# Patient Record
Sex: Male | Born: 1955 | Race: White | Hispanic: No | Marital: Married | Smoking: Never smoker
Health system: Southern US, Community
[De-identification: ages and names within clinical notes are randomized; demographics above are authoritative.]

## PROBLEM LIST (undated history)

## (undated) DIAGNOSIS — Z8546 Personal history of malignant neoplasm of prostate: Secondary | ICD-10-CM

## (undated) DIAGNOSIS — N390 Urinary tract infection, site not specified: Secondary | ICD-10-CM

## (undated) DIAGNOSIS — N3000 Acute cystitis without hematuria: Secondary | ICD-10-CM

## (undated) DIAGNOSIS — R3129 Other microscopic hematuria: Secondary | ICD-10-CM

## (undated) DIAGNOSIS — R8290 Unspecified abnormal findings in urine: Secondary | ICD-10-CM

## (undated) DIAGNOSIS — E782 Mixed hyperlipidemia: Secondary | ICD-10-CM

## (undated) DIAGNOSIS — M4316 Spondylolisthesis, lumbar region: Principal | ICD-10-CM

## (undated) DIAGNOSIS — M48062 Spinal stenosis, lumbar region with neurogenic claudication: Principal | ICD-10-CM

## (undated) DIAGNOSIS — K56609 Unspecified intestinal obstruction, unspecified as to partial versus complete obstruction: Secondary | ICD-10-CM

## (undated) DIAGNOSIS — K269 Duodenal ulcer, unspecified as acute or chronic, without hemorrhage or perforation: Secondary | ICD-10-CM

## (undated) HISTORY — PX: COLOSTOMY REVERSAL: SHX5782

## (undated) HISTORY — PX: PROSTATECTOMY: SHX69

## (undated) HISTORY — PX: CHOLECYSTECTOMY: SHX55

## (undated) HISTORY — PX: OTHER SURGICAL HISTORY: SHX169

## (undated) HISTORY — PX: COLOSTOMY: SHX63

---

## 2013-08-31 LAB — PSA, DIAGNOSTIC: PSA: 0.01 ng/mL

## 2013-09-01 ENCOUNTER — Encounter

## 2013-09-15 LAB — POCT URINALYSIS DIPSTICK W/O MICROSCOPE (AUTO)
Glucose, UA POC: NEGATIVE
Leukocytes, UA: NEGATIVE
Nitrite, UA: NEGATIVE
Protein, UA POC: NEGATIVE

## 2013-09-15 NOTE — Progress Notes (Signed)
Tama Headings Sande Brothers, MD FACS    Urology Office Progress Note    Patient:  Jake Morales  Date of Birth: March 10, 1956  Date: 09/15/2013    HISTORY OF PRESENT ILLNESS:   The patient is a 57 y.o. male who was last seen on 09-16-2012.    Prostate Cancer  Patient is here today for prostate cancer which was first diagnosed several year(s) ago.  His last several PSA values are as follows:  Lab Results   Component Value Date    PSA 0.01 08/31/2013     His clinical TNM stage was: T1c   His pathological TNM stage was: T2b  Gleason Score: 6  Previous treatment of prostate cancer: RRP 01/13/01   Lower urinary tract symptoms: nocturia, 1-2 times per night  Associated Symptoms: No dysuria, gross hematuria.  Pain Severity: Pain Score:   0 - No pain     Urethral Stricture:  Patient is here today for a urethral stricture; the patient began experiencing difficulty with urination several year(s) ago.    The location of the urethral stricture is: bladder neck  Recently his urinary symptoms: show no change  Current medical Rx for urethral stricture: none  Previous treatments tried for urethral stricture: TUIBNC 06/13/09    Summary of old records:   Prostate Cancer: 01/13/01 RRP T1c T2b G6  Bladder Neck contracture: TUIBNC 06/13/09  Male SUI: mild  Microhematuria    Additional History: Trace blood on UA today; still has occasional stress incontinence-does not require pads    Procedures Today: none    Urinalysis today:  Results for POC orders placed in visit on 09/15/13   POCT URINALYSIS DIPSTICK W/O MICROSCOPE (AUTO)       Result Value Range    Color, UA yellow      Clarity, UA clear      Glucose, UA POC negative      Bilirubin, UA        Ketones, UA        Spec Grav, UA        Blood, UA POC trace-intact      pH, UA        Protein, UA POC negative      Urobilinogen, UA        Leukocytes, UA negative      Nitrite, UA negative       Last several PSA's:  Lab Results   Component Value Date    PSA 0.01 08/31/2013     Last total testosterone:  No  results found for this basename: TESTOSTERONE       AUA Symptom Score (09/15/2013):  Incomplete Emptying: 0  Frequency: 1  Intermittency: 0  Urgency: 2  Weak Stream: 1  Straining: 0  Nocturia: 1  Total AUA Symptom Score (QOL): 5 (3)    Last AUA Symptom Score (QOL): 8 (2)    Last BUN and creatinine:  No results found for this basename: BUN     No results found for this basename: CREATININE       Additional Lab/Culture results: none    Imaging Reviewed during this Office Visit: none  (results were independently reviewed by physician and radiology report verified)    PAST MEDICAL, FAMILY AND SOCIAL HISTORY UPDATE:  Past Medical History   Diagnosis Date   ??? Hypercholesteremia    ??? Diverticulosis    ??? Peptic ulcer    ??? Prostatitis, acute    ??? Prostate cancer, primary, with metastasis from prostate to other  site [facility])    ??? Chronic back pain    ??? Knee pain      Past Surgical History   Procedure Laterality Date   ??? Appendectomy     ??? Cholecystectomy     ??? Colonoscopy     ??? Prostate biopsy       11-28-2000   ??? Prostatectomy       01-13-2001   ??? Bladder surgery       tuibnc 06-13-2009     History reviewed. No pertinent family history.  Outpatient Prescriptions Marked as Taking for the 09/15/13 encounter (Office Visit) with Lane Hacker, MD   Medication Sig Dispense Refill   ??? atorvastatin (LIPITOR) 10 MG tablet Take 10 mg by mouth every other day.       ??? Calcium Polycarbophil (FIBER) 625 MG TABS Take 1 capsule by mouth daily.       ??? Multiple Vitamins-Minerals (THERAPEUTIC MULTIVITAMIN-MINERALS) tablet Take 1 tablet by mouth daily.           Jake Morales flavor; Cheese; Mushroom extract complex; and Pollen extract  History   Smoking status   ??? Never Smoker    Smokeless tobacco   ??? Not on file     (If patient a smoker, smoking cessation counseling offered)    History   Alcohol Use   ??? Yes     Comment: SOCIAL       REVIEW OF SYSTEMS:  Constitutional: negative  Eyes: negative  Neurological: negative  Endocrine:  negative  Gastrointestinal: negative  Cardiovascular: negative  Skin: negative   Musculoskeletal: positive for  Joint pains  Ears/Nose/Throat: negative  Respiratory: negative  Hematological/Lymphatic: negative  Psychological: positive for decreased energy level and depressed mood    Physical Exam:      Filed Vitals:    09/15/13 1644   BP: 134/87   Pulse: 64     Patient is a 57 y.o. male in no acute distress and alert and oriented to person, place and time.      Assessment and Plan      1. Postoperative urethral stricture    2. Microscopic hematuria    3. Stress incontinence, male    4. Personal history of malignant neoplasm of prostate           Plan:      Return in about 1 year (around 09/15/2014) for follow up, PSA.   Kegel exercises for stress urinary incontinence.        Tama Headings Elic Vencill, MD FACS  09/15/2013 4:56 PM

## 2014-01-11 LAB — PSA, DIAGNOSTIC: PSA: 0.01 ng/mL

## 2014-01-12 ENCOUNTER — Encounter

## 2014-09-12 ENCOUNTER — Ambulatory Visit
Admit: 2014-09-12 | Discharge: 2014-09-12 | Payer: BLUE CROSS/BLUE SHIELD | Attending: Specialist | Primary: Family Medicine

## 2014-09-12 DIAGNOSIS — N393 Stress incontinence (female) (male): Secondary | ICD-10-CM

## 2014-09-12 LAB — POCT URINALYSIS DIPSTICK W/O MICROSCOPE (AUTO)
Glucose, UA POC: NEGATIVE
Leukocytes, UA: NEGATIVE
Nitrite, UA: NEGATIVE
Protein, UA POC: NEGATIVE

## 2014-09-12 NOTE — Progress Notes (Signed)
Tama HeadingsGregory D. Sande BrothersHaselhuhn, MD FACS    Urology Office Progress Note    Patient:  Jake GeroldGary J Stecklein  Date of Birth: 02/16/1956  Date: 09/12/2014    HISTORY OF PRESENT ILLNESS:   The patient is a 58 y.o. male who was last seen on 09/15/13.    Prostate Cancer  Patient is here today for prostate cancer which was first diagnosed several years ago.  His last several PSA values are as follows:  Lab Results   Component Value Date    PSA 0.01 01/11/2014    PSA 0.01 08/31/2013     Previous treatment of prostate cancer: RRP 01/13/01  Lower urinary tract symptoms: nocturia, 1 times per night  Associated Symptoms: No dysuria, gross hematuria.  Pain Severity: Pain Score:   0 - No pain/10    Summary of old records:   Prostate Cancer: 01/13/01 RRP T1c T2b G6  Bladder Neck contracture: TUIBNC 06/13/09  Male SUI: mild, 1 minipad per day  Microhematuria    Additional History: Stable microhematuria with negative workup in the past. Told to resume Kegel exercises for mild stress urinary incontinence.    Procedures Today: N/A    Urinalysis today:  Results for POC orders placed in visit on 09/12/14   POCT URINALYSIS DIPSTICK W/O MICROSCOPE (AUTO)       Result Value Ref Range    Color, UA Amber      Clarity, UA Clear      Glucose, UA POC Negative      Bilirubin, UA        Ketones, UA        Spec Grav, UA        Blood, UA POC Moderate      pH, UA        Protein, UA POC Negative      Urobilinogen, UA        Leukocytes, UA Negative      Nitrite, UA Negative       Last several PSA's:  Lab Results   Component Value Date    PSA 0.01 01/11/2014    PSA 0.01 08/31/2013     Last total testosterone:  No results found for: TESTOSTERONE    AUA Symptom Score (09/12/2014):  INCOMPLETE EMPTYING: How often have you had the sensation of not emptying your bladder?: Less than 1 to 5 times  FREQUENCY: How often do you have to urinate less than every two hours?: Less than Half the time  INTERMITTENCY: How often have you found you stopped and started again several times  when you urinated?: Not at all  URGENCY: How often have you found it difficult to postpone urination?: Less than 1 to 5 times  WEAK STREAM: How often have you had a weak urinary stream?: Less than Half the time  STRAINING: How often have you had to strain to start  urination?: Not at all  NOCTURIA: How many times did you typically get up at night to uriniate?: 1 Time  TOTAL I-PSS SCORE:: 7  How would you feel if you were to spend the rest of your life with your urinary condition?: Mostly Satisfied    Last AUA Symptom Score (QOL): 5 (3)    Last BUN and creatinine:  No results found for: BUN  No results found for: CREATININE    Additional Lab/Culture results: none    Imaging Reviewed during this Office Visit: none  (results were independently reviewed by physician and radiology report verified)    PAST MEDICAL,  FAMILY AND SOCIAL HISTORY UPDATE:  Past Medical History   Diagnosis Date   ??? Hypercholesteremia    ??? Diverticulosis    ??? Peptic ulcer    ??? Prostatitis, acute    ??? Personal history of prostate cancer    ??? Chronic back pain    ??? Knee pain      Past Surgical History   Procedure Laterality Date   ??? Appendectomy     ??? Cholecystectomy     ??? Colonoscopy     ??? Prostate biopsy       11-28-2000   ??? Prostatectomy       01-13-2001   ??? Bladder surgery       tuibnc 06-13-2009     Family History   Problem Relation Age of Onset   ??? Family history unknown: Yes     Current Outpatient Prescriptions   Medication Sig Dispense Refill   ??? Calcium Polycarbophil (FIBER) 625 MG TABS Take 1 capsule by mouth daily.     ??? Multiple Vitamins-Minerals (THERAPEUTIC MULTIVITAMIN-MINERALS) tablet Take 1 tablet by mouth daily.       No current facility-administered medications for this visit.       Mushroom extract complex and Pollen extract  History   Smoking status   ??? Never Smoker    Smokeless tobacco   ??? Never Used     (If patient a smoker, smoking cessation counseling offered)    History   Alcohol Use   ??? 0.0 oz/week   ??? 0 Not specified per week      Comment: SOCIAL       REVIEW OF SYSTEMS:  Constitutional: negative  Eyes: negative  Neurological: negative  Endocrine: negative  Gastrointestinal: Positive for diarrhea  Cardiovascular: negative  Skin: negative   Musculoskeletal: positive for  arthralgias  Ears/Nose/Throat: negative  Respiratory: negative  Hematological/Lymphatic: negative  Psychological: negative    Physical Exam:      Filed Vitals:    09/12/14 1414   BP: 122/79   Pulse: 65     Body mass index is 25.48 kg/(m^2).  Patient is a 58 y.o. male in no acute distress and alert and oriented to person, place and time.      Assessment and Plan      1. Stress incontinence, male    2. Microscopic hematuria    3. Impotence of organic origin    4. Personal history of malignant neoplasm of prostate           Plan:      Return in about 1 year (around 09/13/2015) for follow up, PSA.  Did not get PSA prior to today's office visit as requested; will send to lab today to get this drawn.  Stable microhematuria with negative workup in the past.   Told to resume Kegel exercises for mild stress urinary incontinence.       Tama HeadingsGregory D. Imanol Bihl, MD FACS  09/12/2014 2:32 PM    2015 PQRS Documentation: N/A

## 2014-10-03 LAB — PSA, DIAGNOSTIC: PSA: 0.01 ng/mL

## 2014-10-05 ENCOUNTER — Encounter

## 2015-09-18 ENCOUNTER — Ambulatory Visit
Admit: 2015-09-18 | Discharge: 2015-09-18 | Payer: BLUE CROSS/BLUE SHIELD | Attending: Specialist | Primary: Family Medicine

## 2015-09-18 DIAGNOSIS — N393 Stress incontinence (female) (male): Secondary | ICD-10-CM

## 2015-09-18 LAB — POCT URINALYSIS DIPSTICK W/O MICROSCOPE (AUTO)
Glucose, UA POC: NEGATIVE
Leukocytes, UA: NEGATIVE
Nitrite, UA: NEGATIVE

## 2015-09-18 LAB — PSA, DIAGNOSTIC: PSA: 0.01 ng/mL

## 2015-09-18 NOTE — Progress Notes (Signed)
Tama HeadingsGregory D. Sande BrothersHaselhuhn, MD FACS    Urology Office Progress Note    Patient:  Jake GeroldGary J Bloyd  Date of Birth: 16-May-1956  Date: 09/18/2015    HISTORY OF PRESENT ILLNESS:   The patient is a 59 y.o. male who was last seen on 09/12/14.    Prostate Cancer  Patient is here today for prostate cancer which was first diagnosed several years ago.  His last several PSA values are as follows:  Lab Results   Component Value Date    PSA 0.01 10/03/2014    PSA 0.01 01/11/2014    PSA 0.01 08/31/2013     Previous treatment of prostate cancer: RRP 01/13/01  Lower urinary tract symptoms: urgency and nocturia, 1 times per night   Associated Symptoms: No dysuria, gross hematuria.  Pain Severity: Pain Score:   0 - No pain/10    Summary of old records:   Prostate Cancer: 01/13/01 RRP T1c T2b G6  Bladder Neck contracture: TUIBNC 06/13/09  Male SUI: mild, 1 minipad per day  Microhematuria    Additional History: Did not get PSA prior to today's office visit as requested; will send to lab today to get this drawn.  Has mild Stress urinary incontinence; recommended Kegel exercises.  Stable microhematuria with negative workup in the past.      Procedures Today: N/A    Urinalysis today:  Results for POC orders placed in visit on 09/18/15   POCT Urinalysis No Micro (Auto)   Result Value Ref Range    Color, UA yellow     Clarity, UA clear     Glucose, UA POC negative     Bilirubin, UA      Ketones, UA      Spec Grav, UA      Blood, UA POC trace-intact     pH, UA      Protein, UA POC trace     Urobilinogen, UA      Leukocytes, UA negative     Nitrite, UA negative      Last several PSA's:  Lab Results   Component Value Date    PSA 0.01 10/03/2014    PSA 0.01 01/11/2014    PSA 0.01 08/31/2013     Last total testosterone:  No results found for: TESTOSTERONE    AUA Symptom Score (09/18/2015):  INCOMPLETE EMPTYING: How often have you had the sensation of not emptying your bladder?: Less than 1 to 5 times  FREQUENCY: How often do you have to urinate less than  every two hours?: Less than 1 to 5 times  INTERMITTENCY: How often have you found you stopped and started again several times when you urinated?: Not at all  URGENCY: How often have you found it difficult to postpone urination?: Less than Half the time  WEAK STREAM: How often have you had a weak urinary stream?: Less than 1 to 5 times  STRAINING: How often have you had to strain to start  urination?: Not at all  NOCTURIA: How many times did you typically get up at night to uriniate?: 1 Time  TOTAL I-PSS SCORE:: 6  How would you feel if you were to spend the rest of your life with your urinary condition?: Mostly Satisfied    Last AUA Symptom Score (QOL): 7 (2)    Last BUN and creatinine:  No results found for: BUN  No results found for: CREATININE    Additional Lab/Culture results: none    Imaging Reviewed during this Office Visit: none  (  results were independently reviewed by physician and radiology report verified)    PAST MEDICAL, FAMILY AND SOCIAL HISTORY UPDATE:  Past Medical History   Diagnosis Date   ??? Chronic back pain    ??? Diverticulosis    ??? Hypercholesteremia    ??? Knee pain    ??? Peptic ulcer    ??? Personal history of prostate cancer    ??? Prostatitis, acute      Past Surgical History   Procedure Laterality Date   ??? Appendectomy     ??? Cholecystectomy     ??? Colonoscopy     ??? Prostate biopsy       11-28-2000   ??? Prostatectomy       01-13-2001   ??? Bladder surgery       tuibnc 06-13-2009     Family History   Problem Relation Age of Onset   ??? Family history unknown: Yes     Current Outpatient Prescriptions   Medication Sig Dispense Refill   ??? Probiotic Product (PROBIOTIC PO) Take by mouth     ??? ERY 2 % PADS CLEANSE SKIN BID  5   ??? Calcium Polycarbophil (FIBER) 625 MG TABS Take 1 capsule by mouth daily.     ??? Multiple Vitamins-Minerals (THERAPEUTIC MULTIVITAMIN-MINERALS) tablet Take 1 tablet by mouth daily.       No current facility-administered medications for this visit.        Mushroom extract complex and Pollen  extract  History   Smoking Status   ??? Never Smoker   Smokeless Tobacco   ??? Never Used     (If patient a smoker, smoking cessation counseling offered)    History   Alcohol Use   ??? 0.0 oz/week   ??? 0 Standard drinks or equivalent per week     Comment: SOCIAL       REVIEW OF SYSTEMS:  Constitutional: negative  Eyes: negative  Neurological: negative  Endocrine: negative  Gastrointestinal: negative  Cardiovascular: negative  Skin: positive for rash   Musculoskeletal: positive for  Joint pain  Ears/Nose/Throat: positive for sneezing  Respiratory: negative  Hematological/Lymphatic: negative  Psychological: negative    Physical Exam:      Vitals:    09/18/15 1013   BP: 121/78   Pulse: 77     Body mass index is 25.4 kg/(m^2).  Patient is a 59 y.o. male in no acute distress and alert and oriented to person, place and time.      Assessment and Plan      1. Stress incontinence, male    2. Microscopic hematuria    3. Personal history of malignant neoplasm of prostate           Plan:      Return in about 1 year (around 09/17/2016) for PSA.  Did not get PSA prior to today's office visit as requested; will send to lab today to get this drawn.    Has mild Stress urinary incontinence; recommended Kegel exercises.    Stable microhematuria with negative workup in the past.         Tama Headings. Goran Olden, MD FACS  09/18/2015 10:23 AM    2016 PQRS Documentation: N/A

## 2015-09-20 ENCOUNTER — Encounter

## 2016-10-07 ENCOUNTER — Encounter: Payer: BLUE CROSS/BLUE SHIELD | Attending: Specialist | Primary: Family Medicine

## 2016-10-08 LAB — PSA, DIAGNOSTIC: PSA: 0.01 ng/mL

## 2016-12-03 ENCOUNTER — Emergency Department: Admit: 2016-12-03 | Payer: BLUE CROSS/BLUE SHIELD | Primary: Family Medicine

## 2016-12-03 ENCOUNTER — Inpatient Hospital Stay
Admit: 2016-12-03 | Discharge: 2016-12-03 | Disposition: A | Discharge status: Home / Self Care | Payer: BLUE CROSS/BLUE SHIELD | Attending: Emergency Medicine

## 2016-12-03 DIAGNOSIS — E162 Hypoglycemia, unspecified: Secondary | ICD-10-CM

## 2016-12-03 LAB — CBC WITH AUTO DIFFERENTIAL
Absolute Eos #: 0.2 10*3/uL (ref 0.0–0.4)
Absolute Lymph #: 1.3 10*3/uL (ref 1.0–4.8)
Absolute Mono #: 0.8 10*3/uL (ref 0.1–1.2)
Basophils Absolute: 0.1 10*3/uL (ref 0.0–0.2)
Basophils: 1 % (ref 0–2)
Eosinophils %: 5 % — ABNORMAL HIGH (ref 1–4)
Hematocrit: 37.2 % — ABNORMAL LOW (ref 41–53)
Hemoglobin: 12.2 g/dL — ABNORMAL LOW (ref 13.5–17.5)
Lymphocytes: 23 % — ABNORMAL LOW (ref 24–44)
MCH: 28 pg (ref 26–34)
MCHC: 32.9 g/dL (ref 31–37)
MCV: 85.1 fL (ref 80–100)
MPV: 8.6 fL (ref 6.0–12.0)
Monocytes: 15 % — ABNORMAL HIGH (ref 2–11)
Platelets: 223 10*3/uL (ref 140–450)
RBC: 4.38 m/uL — ABNORMAL LOW (ref 4.5–5.9)
RDW: 15.9 % — ABNORMAL HIGH (ref 12.5–15.4)
Seg Neutrophils: 56 % (ref 36–66)
Segs Absolute: 3 10*3/uL (ref 1.8–7.7)
WBC: 5.4 10*3/uL (ref 3.5–11.0)

## 2016-12-03 LAB — BASIC METABOLIC PANEL
Anion Gap: 15 mmol/L (ref 9–17)
BUN: 17 mg/dL (ref 8–23)
CO2: 24 mmol/L (ref 20–31)
Calcium: 8.6 mg/dL (ref 8.6–10.4)
Chloride: 103 mmol/L (ref 98–107)
Creatinine: 0.95 mg/dL (ref 0.70–1.20)
GFR African American: >60 mL/min (ref >60)
GFR Non-African American: >60 mL/min (ref >60)
Glucose: 59 mg/dL — ABNORMAL LOW (ref 70–99)
Potassium: 3.7 mmol/L (ref 3.7–5.3)
Sodium: 142 mmol/L (ref 135–144)

## 2016-12-03 LAB — EKG 12-LEAD
Atrial Rate: 48 {beats}/min
P Axis: 44 degrees
P-R Interval: 156 ms
Q-T Interval: 470 ms
QRS Duration: 88 ms
QTc Calculation (Bazett): 419 ms
R Axis: 22 degrees
T Axis: 29 degrees
Ventricular Rate: 48 {beats}/min

## 2016-12-03 LAB — POC GLUCOSE FINGERSTICK
POC Glucose: 69 mg/dL — ABNORMAL LOW (ref 75–110)
POC Glucose: 146 mg/dL — ABNORMAL HIGH (ref 75–110)
POC Glucose: 75 mg/dL (ref 75–110)

## 2016-12-03 LAB — POCT GLUCOSE: Glucose: 75 mg/dL

## 2016-12-03 MED ORDER — SODIUM CHLORIDE 0.9 % IV BOLUS
0.9 % | Freq: Once | INTRAVENOUS | Status: AC
Start: 2016-12-03 — End: 2016-12-03
  Administered 2016-12-03: 05:00:00 1000 mL via INTRAVENOUS

## 2016-12-03 MED ORDER — DEXTROSE 50 % IV SOLN
50 % | Freq: Once | INTRAVENOUS | Status: AC
Start: 2016-12-03 — End: 2016-12-03

## 2016-12-03 MED ORDER — DEXTROSE 50 % IV SOLN
50 % | Freq: Once | INTRAVENOUS | Status: AC
Start: 2016-12-03 — End: 2016-12-03
  Administered 2016-12-03: 05:00:00 25 g via INTRAVENOUS

## 2016-12-03 MED ORDER — DEXTROSE 50 % IV SOLN
50 % | INTRAVENOUS | Status: AC
Start: 2016-12-03 — End: 2016-12-03
  Administered 2016-12-03: 06:00:00 50 via INTRAVENOUS

## 2016-12-03 MED FILL — DEXTROSE 50 % IV SOLN: 50 % | INTRAVENOUS | Qty: 50

## 2016-12-03 NOTE — Discharge Instructions (Signed)
DEHYDRATION DISCHARGE INFORMATION    WHAT IS DEHYDRATION?    Dehydration happens when you lose more body fluids than you take in. Your body needs water and other fluids to be able to do its normal functions. You may lose both water and minerals (salts), you may lose just salts, or you may lose just water. Your body needs the right balance of minerals to keep your heart, kidneys, and other organs working normally. In severe cases of dehydration, the loss of fluid and electrolyte imbalance can cause you to get very sick and may even be fatal.    Dehydration can be caused by:    Severe diarrhea or vomiting  Too much sweating from fever, strenuous exercise, or being out in very hot weather  Increased urination, caused most often by diabetes or taking medicines that help the body get rid of extra fluid (diuretics)  People who are more at risk for dehydration are:    Babies less than 1 year old  Older adults  People with chronic diseases  People who live in or work or exercise outside in hot humid weather  People who live, work, or exercise at high altitude  Athletes who train for and participate in events that take hours or days to complete  HOW CAN I TAKE CARE OF MYSELF WHEN I GO HOME?    How long it takes to get better depends on the cause of your dehydration, your treatment, how well you recover, your overall health, and any complications you may have.    Management    Your provider will give you a list of your medicines when you leave the hospital.  Know your medicines. Know what they look like, how much you should take each time, how often you should take them, and why you take each one.  Take your medicines exactly as your provider tells you to.  Carry a list of your medicines in your wallet or purse. Include any nonprescription medicines and supplements on the list.  Your provider may prescribe medicine to:  Replace the minerals (salts) you have lost  Treat vomiting or diarrhea  Treat the cause of the  dehydration, such as controlling your blood sugar  Treat or prevent an infection  Appointments    Follow your provider's instructions for follow-up appointments and routine tests.  Talk with your provider about any questions or fears you have.  Diet, Exercise, and Other Lifestyle Changes    Follow the treatment plan your healthcare provider prescribes.  Eat a healthy diet.  Drink enough fluids to keep your urine light yellow in color, unless you are told to limit fluids.  Exercise as your provider recommends.  If you live, work or play in a hot, humid climate:  Drink plenty of fluids and eat foods high in water content, such as vegetables and fruit, in advance of strenuous outdoor activity or work.  Drink more at the first sign you are ill  Don't smoke.  Call emergency medical services or 911 if you have new or worsening:    Confusion  Fainting  Seizure or convulsions  Sunken eyes or not able to make tears  Trouble breathing or not able to catch your breath  Do not drive yourself if you have any of these symptoms.    Call your healthcare provider if you have new or worsening:    Dizziness or lightheadedness, especially when you stand up  Fast, slow, or irregular heartbeat  Headache  Increased thirst and dry   mouth  Decreased urination  Muscle cramps or spasms  Vomiting or diarrhea  Tiredness  Weakness

## 2016-12-03 NOTE — ED Notes (Signed)
Jake BernardJames E Young III, MD at bedside for evaluation.     Debbora PrestoAmber N Levoy Geisen, RN  12/03/16 [phone]

## 2016-12-03 NOTE — ED Notes (Signed)
gramcrackers and milk passed with MD permission      Debbora PrestoAmber N Dawson Hollman, RN  12/03/16 [phone]

## 2016-12-03 NOTE — ED Notes (Signed)
Donna BernardJames E Young III, MD at bedside for evaluation.     Debbora PrestoAmber N Batoul Limes, RN  12/03/16 [phone]

## 2016-12-03 NOTE — ED Provider Notes (Signed)
eMERGENCY dEPARTMENT eNCOUnter      Pt Name: Jake Morales  MRN: [id number]  Birthdate [birth year]  Date of evaluation: 12/03/2016      CHIEF COMPLAINT       Chief Complaint   Patient presents with   ??? Hypoglycemia          HISTORY OF PRESENT ILLNESS    Jake GeroldGary J Fargnoli is a 61 y.o. male who presents To the emergency department by ambulance after having an episode of change in mental status secondary to hypoglycemia at home.  Patient can remember his wife's name didn't exactly know where he was.  Blood sugar was about 60 on scene.  Patient is now alert oriented and doing better after IV fluids were established.  He states that he had been drinking some alcohol and eat a lot of junk food also earlier today.    REVIEW OF SYSTEMS         Review of Systems   Constitutional: Negative.    HENT: Negative.    Eyes: Negative.    Respiratory: Negative.    Cardiovascular: Negative.    Gastrointestinal: Negative.    Genitourinary: Negative.    Musculoskeletal: Negative.    Skin: Negative.    Neurological: Positive for sensory change.   Psychiatric/Behavioral: Negative.        PAST MEDICAL HISTORY    has a past medical history of Chronic back pain; Diverticulosis; Hypercholesteremia; Knee pain; Peptic ulcer; Personal history of prostate cancer; and Prostatitis, acute.    SURGICAL HISTORY      has a past surgical history that includes Appendectomy; Cholecystectomy; Colonoscopy; Prostate Biopsy; Prostatectomy; and Bladder surgery.    CURRENT MEDICATIONS       Previous Medications    ASPIRIN 81 MG CHEWABLE TABLET    Take 81 mg by mouth daily    MULTIPLE VITAMINS-MINERALS (THERAPEUTIC MULTIVITAMIN-MINERALS) TABLET    Take 1 tablet by mouth daily.    POLYETHYLENE GLYCOL 3350 (MIRALAX PO)    Take by mouth    PROBIOTIC PRODUCT (PROBIOTIC PO)    Take by mouth       ALLERGIES     is allergic to mushroom extract complex.    FAMILY HISTORY     has no family status information on file.      Family history is unknown by patient.    SOCIAL HISTORY       reports that he has never smoked. He has never used smokeless tobacco. He reports that he drinks alcohol. He reports that he does not use drugs.    PHYSICAL EXAM     INITIAL VITALS:  height is 5\' 7"  (1.702 m) and weight is 79.8 kg (176 lb). His oral temperature is 98.1 ??F (36.7 ??C). His blood pressure is 119/61 and his pulse is 54. His respiration is 16 and oxygen saturation is 99%.      Physical Exam   Constitutional: He is oriented to person, place, and time. He appears well-nourished.   HENT:   Head: Normocephalic and atraumatic.   Eyes: EOM are normal. Pupils are equal, round, and reactive to light.   Neck: Neck supple.   Cardiovascular: Normal rate and regular rhythm.    Pulmonary/Chest: Effort normal and breath sounds normal.   Abdominal: Soft. Bowel sounds are normal.   Musculoskeletal: Normal range of motion.   Lymphadenopathy:     He has no cervical adenopathy.   Neurological: He is oriented to person, place, and time.   Skin: Skin  is warm and dry. Capillary refill takes 2 to 3 seconds.   Psychiatric: He has a normal mood and affect.   Vitals reviewed.      DIFFERENTIAL DIAGNOSIS/ MDM:     Hypoglycemia uncertain etiology patient will get an amp of D50 a liter of fluid and some labs and be reevaluated.  He'll also get a CT of his head.    DIAGNOSTIC RESULTS     EKG: All EKG's are interpreted by the Emergency Department Physician who either signs or Co-signs this chart in the absence of a cardiologist.    EKG shows a sinus bradycardia at 48 bpm, PR of 0.156, QRS duration 0.088, QTc is 419 ms no ST or T wave changes indicative any ischemia just a slow EKG.    Not indicated unless otherwise documented above    LABS:  Results for orders placed or performed during the hospital encounter of 12/03/16   Basic Metabolic Panel   Result Value Ref Range    Glucose 59 (L) 70 - 99 mg/dL    BUN 17 8 - 23 mg/dL    CREATININE 5.40 9.81 - 1.20 mg/dL    Bun/Cre Ratio NOT REPORTED 9 - 20    Calcium 8.6 8.6 - 10.4 mg/dL     Sodium 191 478 - 295 mmol/L    Potassium 3.7 3.7 - 5.3 mmol/L    Chloride 103 98 - 107 mmol/L    CO2 24 20 - 31 mmol/L    Anion Gap 15 9 - 17 mmol/L    GFR Non-African American >60 >60 mL/min    GFR African American >60 >60 mL/min    GFR Comment          GFR Staging NOT REPORTED    CBC Auto Differential   Result Value Ref Range    WBC 5.4 3.5 - 11.0 k/uL    RBC 4.38 (L) 4.5 - 5.9 m/uL    Hemoglobin 12.2 (L) 13.5 - 17.5 g/dL    Hematocrit 62.1 (L) 41 - 53 %    MCV 85.1 80 - 100 fL    MCH 28.0 26 - 34 pg    MCHC 32.9 31 - 37 g/dL    RDW 30.8 (H) 65.7 - 15.4 %    Platelets 223 140 - 450 k/uL    MPV 8.6 6.0 - 12.0 fL    NRBC Automated NOT REPORTED per 100 WBC    Differential Type NOT REPORTED     Seg Neutrophils 56 36 - 66 %    Lymphocytes 23 (L) 24 - 44 %    Monocytes 15 (H) 2 - 11 %    Eosinophils % 5 (H) 1 - 4 %    Basophils 1 0 - 2 %    Immature Granulocytes NOT REPORTED 0 %    Segs Absolute 3.00 1.8 - 7.7 k/uL    Absolute Lymph # 1.30 1.0 - 4.8 k/uL    Absolute Mono # 0.80 0.1 - 1.2 k/uL    Absolute Eos # 0.20 0.0 - 0.4 k/uL    Basophils # 0.10 0.0 - 0.2 k/uL    Absolute Immature Granulocyte NOT REPORTED 0.00 - 0.30 k/uL    WBC Morphology NOT REPORTED     RBC Morphology NOT REPORTED     Platelet Estimate NOT REPORTED    POC Glucose Fingerstick   Result Value Ref Range    POC Glucose 69 (L) 75 - 110 mg/dL   POCT Glucose   Result  Value Ref Range    Glucose 75 mg/dL    QC OK? yes    POC Glucose Fingerstick   Result Value Ref Range    POC Glucose 75 75 - 110 mg/dL       Not indicated unless otherwise documented above    RADIOLOGY:   I reviewed the radiologist interpretations:  CT Head WO Contrast   Final Result   No acute intracranial abnormality.           Ct Head Wo Contrast    Result Date: 12/03/2016  EXAMINATION: CT OF THE HEAD WITHOUT CONTRAST  12/03/2016 12:46 am TECHNIQUE: CT of the head was performed without the administration of intravenous contrast. Dose modulation, iterative reconstruction, and/or weight based  adjustment of the mA/kV was utilized to reduce the radiation dose to as low as reasonably achievable. COMPARISON: None. HISTORY: ORDERING SYSTEM PROVIDED HISTORY: change mental status TECHNOLOGIST PROVIDED HISTORY: Ordering Physician Provided Reason for Exam: confussion Acuity: Acute Type of Exam: Initial Additional signs and symptoms: low blood sugar FINDINGS: BRAIN/VENTRICLES: There is no acute intracranial hemorrhage, mass effect or midline shift.  No abnormal extra-axial fluid collection.  The gray-white differentiation is maintained without evidence of an acute infarct.  There is no evidence of hydrocephalus. ORBITS: The visualized portion of the orbits demonstrate no acute abnormality. SINUSES: The visualized paranasal sinuses and mastoid air cells demonstrate no acute abnormality. SOFT TISSUES/SKULL:  No acute abnormality of the visualized skull or soft tissues.     No acute intracranial abnormality.     Not indicated unless otherwise documented above    EMERGENCY DEPARTMENT COURSE:     The patient was given the following medications:  Orders Placed This Encounter   Medications   ??? dextrose 50 % solution 25 g   ??? 0.9 % sodium chloride bolus   ??? dextrose 50 % solution     [facility], [facility]: cabinet override   ??? dextrose 50 % solution 25 g        Vitals:    Vitals:    12/03/16 0003   BP: 119/61   Pulse: 54   Resp: 16   Temp: 98.1 ??F (36.7 ??C)   TempSrc: Oral   SpO2: 99%   Weight: 79.8 kg (176 lb)   Height: 5\' 7"  (1.702 m)     -------------------------  BP 119/61    Pulse 54    Temp 98.1 ??F (36.7 ??C) (Oral)    Resp 16    Ht 5\' 7"  (1.702 m)    Wt 79.8 kg (176 lb)    SpO2 99%    BMI 27.57 kg/m??         I have reviewed the disposition diagnosis with the patient and or their family/guardian. I have answered their questions and given discharge instructions. They voiced understanding of these instructions and did not have any further questions or complaints.    CRITICAL  CARE:    None    CONSULTS:    None    PROCEDURES:    None      OARRS Report if indicated             FINAL IMPRESSION      1. Hypoglycemia    2. Dehydration          DISPOSITION/PLAN   DISPOSITION Decision To Discharge  I have reviewed the disposition diagnosis with the patient and or their family/guardian.  I have answered their questions and given discharge instructions.  They voiced understanding of  these instructions and did not have any further questions or complaints.      PATIENT REFERRED TO:  Aundra Dubin, MD  [address]  [place]  [place] [address]  [phone]    In 2 days  referral to an endocrinologist      DISCHARGE MEDICATIONS:  New Prescriptions    No medications on file       (Please note that portions of this note were completed with a voice recognition program.  Efforts were made to edit the dictations but occasionally words are mis-transcribed.)    [name],, MD  Attending Emergency Physician              [name], MD  12/03/16 [phone]

## 2016-12-03 NOTE — ED Notes (Addendum)
Pt to exam room via ems with c/o low blood sugar and mental confusion. POC BS was 69 upon arrival. The pt stated that he has has several episodes of hypoglycemia in the past and usually can manage this on his on but tonight he was attempting to back a truck into the driveway and he knew his sugar was getting low but he continued to try to back the truck up, and he took too long before he attempted to eat. The pt was very confused and had trouble ambulating but this had improved prior to arrival here, the pt ate some candy.      Dorathy KinsmanKevin L Anarely Nicholls, RN  12/03/16 0006       Dorathy KinsmanKevin L Johncarlos Holtsclaw, RN  12/03/16 0010

## 2017-04-13 DIAGNOSIS — S60452A Superficial foreign body of right middle finger, initial encounter: Secondary | ICD-10-CM

## 2017-04-13 NOTE — ED Notes (Signed)
Pt wound cleansed, bacitracin applied to finger and tube gauze dressing applied to rt middle finger per MD order.      Deanne CofferJennifer N Jyron Turman, RN  04/13/17 2300

## 2017-04-13 NOTE — ED Notes (Signed)
Pt denies any s/s reaction to IM injection. Patient cleared for discharge per MD. Patient discharge instructions explained, Rx given and explained to patient. Patient Verbalized understanding of all instructions and all patient questions answered to their satisfaction. Patient departs from ED in stable condition.        Deanne CofferJennifer N Riely Baskett, RN  04/13/17 2300

## 2017-04-13 NOTE — ED Provider Notes (Signed)
[facility]  eMERGENCY dEPARTMENT eNCOUnter      Pt Name: Jake GeroldGary J Chaikin  MRN: [id number]  Birthdate 05-15-1956  Date of evaluation: 04/14/17      CHIEF COMPLAINT       Chief Complaint   Patient presents with   . Hand Injury         HISTORY OF PRESENT ILLNESS    Jake Morales is a 61 y.o. male who presents To the emergency department complaining of fishhook embedded in the right middle finger at about 845 p.m. tonight accidentally while fishing.  Patient complains of sharp pain 5-6 out of 10 in intensity localized to the terminal phalanx of the right middle finger.  He denies any tingling, numbness or weakness distally.  Pain is worse with the movements and better with rest.  Patient has not taken any medications for the pain.  He is right-hand dominant.  Patient does not take any blood thinners.  Last tetanus injection is unknown.  Denies any previous injuries or fractures to the right hand.       REVIEW OF SYSTEMS       Review of Systems   Constitutional: Negative for chills and fever.   Skin: Positive for wound.   Neurological: Negative for weakness and numbness.   All other systems reviewed and are negative.         PAST MEDICAL HISTORY    has a past medical history of Chronic back pain; Diverticulosis; Hypercholesteremia; Knee pain; Peptic ulcer; Personal history of prostate cancer; and Prostatitis, acute.    SURGICAL HISTORY      has a past surgical history that includes Appendectomy; Cholecystectomy; Colonoscopy; Prostate Biopsy; Prostatectomy; Bladder surgery; colostomy; Revision Colostomy; and colectomy.    CURRENT MEDICATIONS       Discharge Medication List as of 04/13/2017 10:30 PM      CONTINUE these medications which have NOT CHANGED    Details   aspirin 81 MG chewable tablet Take 81 mg by mouth dailyHistorical Med      Polyethylene Glycol 3350 (MIRALAX PO) Take by mouthHistorical Med      Probiotic Product (PROBIOTIC PO) Take by mouth      Multiple Vitamins-Minerals (THERAPEUTIC  MULTIVITAMIN-MINERALS) tablet Take 1 tablet by mouth daily.             ALLERGIES     is allergic to mushroom extract complex.    FAMILY HISTORY     has no family status information on file.      Family history is unknown by patient.    SOCIAL HISTORY      reports that he has never smoked. He has never used smokeless tobacco. He reports that he drinks alcohol. He reports that he does not use drugs.    PHYSICAL EXAM     INITIAL VITALS:  height is 5\' 10"  (1.778 m) and weight is 79.4 kg (175 lb). His oral temperature is 98.1 F (36.7 C). His blood pressure is 137/82 and his pulse is 63. His respiration is 16 and oxygen saturation is 99%.      Physical Exam   Constitutional: He is oriented to person, place, and time. He appears well-developed and well-nourished.   HENT:   Head: Normocephalic and atraumatic.   Nose: Nose normal.   Mouth/Throat: Oropharynx is clear and moist.   Eyes: EOM are normal. Pupils are equal, round, and reactive to light.   Neck: Normal range of motion. Neck supple.   Cardiovascular: Normal rate, regular  rhythm, normal heart sounds and intact distal pulses.    No murmur heard.  Pulmonary/Chest: Effort normal and breath sounds normal. No respiratory distress.   Abdominal: Soft. Bowel sounds are normal. He exhibits no distension. There is no tenderness.   Musculoskeletal:   Patient has a fishhook embedded on the radial aspect of the right hand in the terminal phalanx of the middle finger.  The nail plate and nail bed are not involved.  Neurovascular examination is intact distally.   Neurological: He is alert and oriented to person, place, and time.   Skin: Skin is warm and dry.   Nursing note and vitals reviewed.      DIFFERENTIAL DIAGNOSIS/ MDM:     Fishhook right middle finger  Will removal under ring block of the right middle finger    DIAGNOSTIC RESULTS     EKG: All EKG's are interpreted by the Emergency Department Physician who either signs or Co-signs this chart in the absence of a  cardiologist.    None obtained    RADIOLOGY:   Non-plain film images such as CT, Ultrasound and MRI are read by the radiologist. Plain radiographic images are visualized and the radiologist interpretations are reviewed as follows:     None obtained    LABS:  No results found for this visit on 04/13/17.      EMERGENCY DEPARTMENT COURSE:   Vitals:    Vitals:    04/13/17 2134   BP: 137/82   Pulse: 63   Resp: 16   Temp: 98.1 F (36.7 C)   TempSrc: Oral   SpO2: 99%   Weight: 79.4 kg (175 lb)   Height: 5\' 10"  (1.778 m)     -------------------------  BP: 137/82, Temp: 98.1 F (36.7 C), Pulse: 63, Resp: 16    Orders Placed This Encounter   Medications   . lidocaine 1 % injection 5 mL   . Tetanus-Diphth-Acell Pertussis (BOOSTRIX) injection 0.5 mL   . bacitracin ointment   . cephALEXin (KEFLEX) 500 MG capsule     Sig: Take 1 capsule by mouth 4 times daily for 7 days     Dispense:  28 capsule     Refill:  0         During emergency department course, patient was given Boostrix 0.5 mg intramuscularly.  The foreign body was removed by myself.  Please see procedure note.  Patient was given wound care instructions and discharged home on Keflex 500 mg 4 times daily for 7 days.  He is advised to take Tylenol and/or ibuprofen as needed for the pain, apply ice packs locally, elevate the right hand, follow up with PCP for wound recheck in 2 days, return if worse.    CONSULTS:  None    PROCEDURES:  Under ring block with 1% lidocaine without epinephrine of the right middle finger and under strict aseptic precautions the foreign body in the form of fishhook was removed by myself.  The entrance wound was entered with the help of 18-gauge needle, the barb of the fishhook was held along with the bevel side of the needle and the 18-gauge needle and a fishhook were removed successfully.  Patient tolerated the procedure well.  Bacitracin and a dressing was applied.    FINAL IMPRESSION      1. Fish hook injury of finger, right, initial  encounter          DISPOSITION/PLAN       PATIENT REFERRED TO:  Aundra Dubin, MD  [address]  [place]  [place] [address]  [phone]    Call in 2 days  For wound re-check    [facility]  [id number]  [place] [address]  [phone]    If symptoms worsen      DISCHARGE MEDICATIONS:  Discharge Medication List as of 04/13/2017 10:30 PM      START taking these medications    Details   cephALEXin (KEFLEX) 500 MG capsule Take 1 capsule by mouth 4 times daily for 7 days, Disp-28 capsule, R-0Print             (Please note that portions of this note were completed with a voice recognition program.  Efforts were made to edit the dictations but occasionally words are mis-transcribed.)    Lenice Pressman,, MD, F.A.C.E.P.  Attending Emergency Medicine Physician         Lenice Pressman, MD  04/14/17 [phone]

## 2017-04-14 ENCOUNTER — Inpatient Hospital Stay
Admit: 2017-04-14 | Discharge: 2017-04-14 | Disposition: A | Discharge status: Home / Self Care | Payer: BLUE CROSS/BLUE SHIELD | Attending: Specialist

## 2017-04-14 MED ORDER — TETANUS-DIPHTH-ACELL PERTUSSIS 5-2.5-18.5 LF-MCG/0.5 IM SUSP
Freq: Once | INTRAMUSCULAR | Status: AC
Start: 2017-04-14 — End: 2017-04-13
  Administered 2017-04-14: 03:00:00 0.5 mL via INTRAMUSCULAR

## 2017-04-14 MED ORDER — BACITRACIN 500 UNIT/GM EX OINT
500 UNIT/GM | Freq: Once | CUTANEOUS | Status: AC
Start: 2017-04-14 — End: 2017-04-13
  Administered 2017-04-14: 03:00:00 via TOPICAL

## 2017-04-14 MED ORDER — CEPHALEXIN 500 MG PO CAPS
500 MG | ORAL_CAPSULE | Freq: Four times a day (QID) | ORAL | 0 refills | Status: AC
Start: 2017-04-14 — End: 2017-04-20

## 2017-04-14 MED ORDER — LIDOCAINE HCL 1 % IJ SOLN
1 % | Freq: Once | INTRAMUSCULAR | Status: AC
Start: 2017-04-14 — End: 2017-04-13
  Administered 2017-04-14: 02:00:00 5 mL via INTRADERMAL

## 2017-04-14 MED FILL — LIDOCAINE HCL 1 % IJ SOLN: 1 % | INTRAMUSCULAR | Qty: 20

## 2017-04-14 MED FILL — BOOSTRIX 5-2.5-18.5 IM SUSP: INTRAMUSCULAR | Qty: 0.5

## 2017-05-13 LAB — PSA, DIAGNOSTIC: PSA: 0.02 ng/mL

## 2017-07-02 ENCOUNTER — Ambulatory Visit
Admit: 2017-07-02 | Discharge: 2017-07-02 | Payer: BLUE CROSS/BLUE SHIELD | Attending: Specialist | Primary: Family Medicine

## 2017-07-02 DIAGNOSIS — R3129 Other microscopic hematuria: Secondary | ICD-10-CM

## 2017-07-02 LAB — POCT URINALYSIS DIPSTICK W/O MICROSCOPE (AUTO)
Glucose, UA POC: NEGATIVE
Leukocytes, UA: NEGATIVE
Nitrite, UA: NEGATIVE
Protein, UA POC: NEGATIVE

## 2017-07-02 NOTE — Progress Notes (Signed)
Tama Headings Sande Brothers, MD FACS    Urology Office Progress Note    Patient:  Jake Morales  Date of Birth: [birth year]  Date: 07/02/2017    HISTORY OF PRESENT ILLNESS:   The patient is a 61 y.o. male who was last seen on 09/18/15.    MicroHematuria:  Patient is here today with a history microscopic hematuria which was first noted several year(s) ago.    On today's Urinalysis the blood is unchanged.    Lower urinary tract symptoms: nocturia, 1 times per night and urine incontinence:  stress    Associated Symptoms: No dysuria, gross hematuria.  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   Stable microhematuria with negative workup in the past.   Patient has mild and sporadic Stress urinary incontinence; no further Rx at this time.     Procedures Today: N/A    Urinalysis today:  Results for POC orders placed in visit on 07/02/17   POCT Urinalysis No Micro (Auto)   Result Value Ref Range    Color, UA yellow     Clarity, UA clear     Glucose, UA POC negative     Bilirubin, UA      Ketones, UA      Spec Grav, UA      Blood, UA POC trace-lysed     pH, UA      Protein, UA POC negative     Urobilinogen, UA      Leukocytes, UA negative     Nitrite, UA negative      Last several PSA's:  Lab Results   Component Value Date    PSA 0.02 05/13/2017    PSA 0.01 10/08/2016    PSA 0.01 09/18/2015     Last total testosterone:  No results found for: TESTOSTERONE    AUA Symptom Score (07/02/2017):  INCOMPLETE EMPTYING: How often have you had the sensation of not emptying your bladder?: Less than 1 to 5 times  FREQUENCY: How often do you have to urinate less than every two hours?: Less than 1 to 5 times  INTERMITTENCY: How often have you found you stopped and started again several times when you urinated?: Not at  all  URGENCY: How often have you found it difficult to postpone urination?: Less than 1 to 5 times  WEAK STREAM: How often have you had a weak urinary stream?: Less than 1 to 5 times  STRAINING: How often have you had to strain to start  urination?: Not at all  NOCTURIA: How many times did you typically get up at night to uriniate?: 1 Time  TOTAL I-PSS SCORE:: 5  How would you feel if you were to spend the rest of your life with your urinary condition?: Mixe    Last AUA Symptom Score (QOL): 6 (2)    Last BUN and creatinine:  Lab Results   Component Value Date    BUN 17 12/03/2016     Lab Results   Component Value Date    CREATININE 0.95 12/03/2016       Additional Lab/Culture results: none    Imaging Reviewed during this Office Visit: none  (results were independently reviewed by physician and radiology report verified)  PAST MEDICAL, FAMILY AND SOCIAL HISTORY UPDATE:  Past Medical History:   Diagnosis Date   . Chronic back pain    . Diverticulosis    . Hypercholesteremia    . Knee pain    . Peptic ulcer    . Personal history of prostate cancer    . Prostatitis, acute      Past Surgical History:   Procedure Laterality Date   . APPENDECTOMY     . BLADDER SURGERY      tuibnc 06-13-2009   . CHOLECYSTECTOMY     . COLECTOMY     . COLONOSCOPY     . COLOSTOMY     . PROSTATE BIOPSY      11-28-2000   . PROSTATECTOMY      01-13-2001   . REVISION COLOSTOMY       Family History   Problem Relation Age of Onset   . Family history unknown: Yes     Current Outpatient Prescriptions   Medication Sig Dispense Refill   . ferrous sulfate 325 (65 Fe) MG tablet TK 1 T PO BID WITH MEALS QOD  3   . aspirin 81 MG chewable tablet Take 81 mg by mouth daily     . Polyethylene Glycol 3350 (MIRALAX PO) Take by mouth     . Probiotic Product (PROBIOTIC PO) Take by mouth     . Multiple Vitamins-Minerals (THERAPEUTIC MULTIVITAMIN-MINERALS) tablet Take 1 tablet by mouth daily.       No current facility-administered medications for this visit.        Bee  pollen and Mushroom extract complex  History   Smoking Status   . Never Smoker   Smokeless Tobacco   . Never Used     (If patient a smoker, smoking cessation counseling offered)    History   Alcohol Use   . 0.0 oz/week     Comment: SOCIAL       REVIEW OF SYSTEMS (obtained by ancillary medical staff):  Constitutional: negative  Eyes: negative  Neurological: negative  Endocrine: positive for heat intolerance  Gastrointestinal: Positive for diarrhea  Cardiovascular: negative  Skin: negative   Musculoskeletal: positive for  arthralgias  Ears/Nose/Throat: negative  Respiratory: negative  Hematological/Lymphatic: negative  Psychological: negative    Physical Exam:      Vitals:    07/02/17 1637   BP: (!) 146/82   Pulse: 75     Body mass index is 25.18 kg/m.  Patient is a 61 y.o. male in no acute distress and alert and oriented to person, place and time.      Assessment and Plan      1. Microscopic hematuria    2. Stress incontinence, male    3. Personal history of malignant neoplasm of prostate           Plan:      Return in about 1 year (around 07/02/2018) for PSA.  No evidence of prostate cancer recurrence.     Stable microhematuria with negative workup in the past.     Patient has mild and sporadic Stress urinary incontinence; no further Rx at this time.            Tama Headings Fines Kimberlin, MD FACS  07/02/2017 4:46 PM    2018 PQRS Documentation: N/A

## 2017-12-19 MED ORDER — SILDENAFIL CITRATE 20 MG PO TABS
20 | ORAL_TABLET | ORAL | 11 refills | Status: DC | PRN
Start: 2017-12-19 — End: 2020-01-19

## 2017-12-19 NOTE — Telephone Encounter (Signed)
See written Rx for generic sildenafil 20 mg (1-5 tabs) prn ED.

## 2017-12-19 NOTE — Telephone Encounter (Signed)
Patient called to see if he could get another sample of Viagra.  He said you gave it to him quite a while ago.  He was last seen 07/02/17 and is due back 07/02/18.  I told him Viagra is now generic Sildenafil and told him about the Good Rx card and list of places he can get it.  So he would like a printed  Rx for Sildenafil and he will pick it up next week along with the Good Rx card if that is ok.

## 2018-01-18 ENCOUNTER — Inpatient Hospital Stay (HOSPITAL_COMMUNITY): Payer: BLUE CROSS/BLUE SHIELD

## 2018-01-18 ENCOUNTER — Inpatient Hospital Stay (HOSPITAL_COMMUNITY)
Admission: EM | Admit: 2018-01-18 | Discharge: 2018-01-20 | DRG: 389 | Disposition: A | Discharge status: Home / Self Care | Payer: BLUE CROSS/BLUE SHIELD | Attending: Family Medicine | Admitting: Family Medicine

## 2018-01-18 ENCOUNTER — Emergency Department (HOSPITAL_COMMUNITY): Payer: BLUE CROSS/BLUE SHIELD

## 2018-01-18 ENCOUNTER — Encounter (HOSPITAL_COMMUNITY): Payer: Self-pay

## 2018-01-18 DIAGNOSIS — R112 Nausea with vomiting, unspecified: Secondary | ICD-10-CM | POA: Diagnosis not present

## 2018-01-18 DIAGNOSIS — R1084 Generalized abdominal pain: Secondary | ICD-10-CM | POA: Diagnosis not present

## 2018-01-18 DIAGNOSIS — N179 Acute kidney failure, unspecified: Secondary | ICD-10-CM | POA: Diagnosis present

## 2018-01-18 DIAGNOSIS — D649 Anemia, unspecified: Secondary | ICD-10-CM | POA: Diagnosis present

## 2018-01-18 DIAGNOSIS — Z0189 Encounter for other specified special examinations: Secondary | ICD-10-CM

## 2018-01-18 DIAGNOSIS — K56609 Unspecified intestinal obstruction, unspecified as to partial versus complete obstruction: Secondary | ICD-10-CM | POA: Diagnosis present

## 2018-01-18 DIAGNOSIS — Z9049 Acquired absence of other specified parts of digestive tract: Secondary | ICD-10-CM | POA: Diagnosis not present

## 2018-01-18 DIAGNOSIS — E860 Dehydration: Secondary | ICD-10-CM | POA: Diagnosis present

## 2018-01-18 DIAGNOSIS — Z8711 Personal history of peptic ulcer disease: Secondary | ICD-10-CM

## 2018-01-18 DIAGNOSIS — K565 Intestinal adhesions [bands], unspecified as to partial versus complete obstruction: Principal | ICD-10-CM | POA: Diagnosis present

## 2018-01-18 DIAGNOSIS — E785 Hyperlipidemia, unspecified: Secondary | ICD-10-CM | POA: Diagnosis present

## 2018-01-18 HISTORY — DX: Unspecified intestinal obstruction, unspecified as to partial versus complete obstruction: K56.609

## 2018-01-18 HISTORY — DX: Duodenal ulcer, unspecified as acute or chronic, without hemorrhage or perforation: K26.9

## 2018-01-18 LAB — URINALYSIS, ROUTINE W REFLEX MICROSCOPIC
Bilirubin Urine: NEGATIVE
GLUCOSE, UA: NEGATIVE mg/dL
Hgb urine dipstick: NEGATIVE
KETONES UR: NEGATIVE mg/dL
LEUKOCYTES UA: NEGATIVE
Nitrite: NEGATIVE
PH: 5 (ref 5.0–8.0)
Protein, ur: NEGATIVE mg/dL
Specific Gravity, Urine: 1.021 (ref 1.005–1.030)

## 2018-01-18 LAB — CBC
HCT: 37.9 % — ABNORMAL LOW (ref 39.0–52.0)
HEMATOCRIT: 42.6 % (ref 39.0–52.0)
Hemoglobin: 14.2 g/dL (ref 13.0–17.0)
Hemoglobin: 12.5 g/dL — ABNORMAL LOW (ref 13.0–17.0)
MCH: 29.5 pg (ref 26.0–34.0)
MCH: 29.1 pg (ref 26.0–34.0)
MCHC: 33 g/dL (ref 30.0–36.0)
MCHC: 33.3 g/dL (ref 30.0–36.0)
MCV: 88.3 fL (ref 78.0–100.0)
MCV: 88.4 fL (ref 78.0–100.0)
PLATELETS: 227 10*3/uL (ref 150–400)
Platelets: 294 10*3/uL (ref 150–400)
RBC: 4.82 MIL/uL (ref 4.22–5.81)
RBC: 4.29 MIL/uL (ref 4.22–5.81)
RDW: 13.5 % (ref 11.5–15.5)
RDW: 13.7 % (ref 11.5–15.5)
WBC: 7.8 10*3/uL (ref 4.0–10.5)
WBC: 9.4 10*3/uL (ref 4.0–10.5)

## 2018-01-18 LAB — COMPREHENSIVE METABOLIC PANEL
ALT: 18 U/L (ref 17–63)
AST: 27 U/L (ref 15–41)
Albumin: 4 g/dL (ref 3.5–5.0)
Alkaline Phosphatase: 84 U/L (ref 38–126)
Anion gap: 10 (ref 5–15)
BUN: 25 mg/dL — AB (ref 6–20)
CHLORIDE: 106 mmol/L (ref 101–111)
CO2: 22 mmol/L (ref 22–32)
Calcium: 9.2 mg/dL (ref 8.9–10.3)
Creatinine, Ser: 1.22 mg/dL (ref 0.61–1.24)
GFR calc Af Amer: >60 mL/min (ref >60)
GFR calc non Af Amer: >60 mL/min (ref >60)
GLUCOSE: 128 mg/dL — AB (ref 65–99)
POTASSIUM: 3.8 mmol/L (ref 3.5–5.1)
SODIUM: 138 mmol/L (ref 135–145)
Total Bilirubin: 0.7 mg/dL (ref 0.3–1.2)
Total Protein: 7.2 g/dL (ref 6.5–8.1)

## 2018-01-18 LAB — GLUCOSE, CAPILLARY: Glucose-Capillary: 130 mg/dL — ABNORMAL HIGH (ref 65–99)

## 2018-01-18 LAB — TYPE AND SCREEN
ABO/RH(D): O NEG
ANTIBODY SCREEN: NEGATIVE

## 2018-01-18 LAB — PROTIME-INR
INR: 0.97
Prothrombin Time: 12.8 seconds (ref 11.4–15.2)

## 2018-01-18 LAB — BASIC METABOLIC PANEL
Anion gap: 10 (ref 5–15)
BUN: 24 mg/dL — AB (ref 6–20)
CALCIUM: 8 mg/dL — AB (ref 8.9–10.3)
CO2: 18 mmol/L — AB (ref 22–32)
CREATININE: 1.01 mg/dL (ref 0.61–1.24)
Chloride: 109 mmol/L (ref 101–111)
GFR calc Af Amer: >60 mL/min (ref >60)
GFR calc non Af Amer: >60 mL/min (ref >60)
GLUCOSE: 118 mg/dL — AB (ref 65–99)
Potassium: 4.3 mmol/L (ref 3.5–5.1)
Sodium: 137 mmol/L (ref 135–145)

## 2018-01-18 LAB — ABO/RH: ABO/RH(D): O NEG

## 2018-01-18 LAB — LIPASE, BLOOD: LIPASE: 28 U/L (ref 11–51)

## 2018-01-18 LAB — APTT: APTT: 27 s (ref 24–36)

## 2018-01-18 LAB — HIV ANTIBODY (ROUTINE TESTING W REFLEX): HIV Screen 4th Generation wRfx: NONREACTIVE

## 2018-01-18 MED ORDER — IOPAMIDOL (ISOVUE-300) INJECTION 61%
INTRAVENOUS | Status: AC
  Filled 2018-01-18: qty 100

## 2018-01-18 MED ORDER — SODIUM CHLORIDE 0.9 % IV SOLN
INTRAVENOUS | Status: DC
  Administered 2018-01-18 – 2018-01-19 (×4): via INTRAVENOUS

## 2018-01-18 MED ORDER — ONDANSETRON 4 MG PO TBDP
4.0000 mg | ORAL_TABLET | Freq: Once | ORAL | Status: AC | PRN
  Administered 2018-01-18: 4 mg via ORAL
  Filled 2018-01-18: qty 1

## 2018-01-18 MED ORDER — IOPAMIDOL (ISOVUE-300) INJECTION 61%
100.0000 mL | Freq: Once | INTRAVENOUS | Status: AC | PRN
  Administered 2018-01-18: 100 mL via INTRAVENOUS

## 2018-01-18 MED ORDER — MORPHINE SULFATE (PF) 4 MG/ML IV SOLN
4.0000 mg | Freq: Once | INTRAVENOUS | Status: AC
  Administered 2018-01-18: 4 mg via INTRAVENOUS
  Filled 2018-01-18: qty 1

## 2018-01-18 MED ORDER — MORPHINE SULFATE (PF) 4 MG/ML IV SOLN: 2.0000 mg | INTRAVENOUS | Status: DC | PRN

## 2018-01-18 MED ORDER — DIATRIZOATE MEGLUMINE & SODIUM 66-10 % PO SOLN
90.0000 mL | Freq: Once | ORAL | Status: AC
  Administered 2018-01-18: 90 mL via NASOGASTRIC
  Filled 2018-01-18: qty 90

## 2018-01-18 MED ORDER — ACETAMINOPHEN 325 MG PO TABS
650.0000 mg | ORAL_TABLET | Freq: Four times a day (QID) | ORAL | Status: DC | PRN
  Administered 2018-01-19: 650 mg via ORAL
  Filled 2018-01-18: qty 2

## 2018-01-18 MED ORDER — ACETAMINOPHEN 650 MG RE SUPP: 650.0000 mg | Freq: Four times a day (QID) | RECTAL | Status: DC | PRN

## 2018-01-18 MED ORDER — PHENOL 1.4 % MT LIQD
1.0000 | OROMUCOSAL | Status: DC | PRN
  Administered 2018-01-18: 1 via OROMUCOSAL
  Filled 2018-01-18: qty 177

## 2018-01-18 MED ORDER — ONDANSETRON HCL 4 MG/2ML IJ SOLN
4.0000 mg | Freq: Three times a day (TID) | INTRAMUSCULAR | Status: DC | PRN
  Administered 2018-01-18 – 2018-01-19 (×3): 4 mg via INTRAVENOUS
  Filled 2018-01-18 (×3): qty 2

## 2018-01-18 MED ORDER — ONDANSETRON HCL 4 MG/2ML IJ SOLN
4.0000 mg | Freq: Once | INTRAMUSCULAR | Status: AC
  Administered 2018-01-18: 4 mg via INTRAVENOUS
  Filled 2018-01-18: qty 2

## 2018-01-18 MED ORDER — ZOLPIDEM TARTRATE 5 MG PO TABS: 5.0000 mg | ORAL_TABLET | Freq: Every evening | ORAL | Status: DC | PRN

## 2018-01-18 MED ORDER — SODIUM CHLORIDE 0.9 % IV BOLUS
1000.0000 mL | Freq: Once | INTRAVENOUS | Status: AC
  Administered 2018-01-18: 1000 mL via INTRAVENOUS

## 2018-01-18 MED ORDER — MORPHINE SULFATE (PF) 4 MG/ML IV SOLN
2.0000 mg | INTRAVENOUS | Status: DC | PRN
  Administered 2018-01-18 – 2018-01-19 (×2): 2 mg via INTRAVENOUS
  Filled 2018-01-18 (×2): qty 1

## 2018-01-18 NOTE — ED Triage Notes (Signed)
Pt states that he has had generalized abd pain since this evening, vomited several times. Denies diarrhea, but states 5 BM since dinner. Pt has hx of multiple bowel obstructions, colostomy and reversal, large part of bowel removed, abdomen is distended.

## 2018-01-18 NOTE — ED Notes (Signed)
Dr. Clyde LundborgNiu rounding at bedside

## 2018-01-18 NOTE — ED Notes (Signed)
Patient transported to CT 

## 2018-01-18 NOTE — Consult Note (Signed)
Reason for Consult: Small bowel obstruction Referring Physician: Dr. Ivor Costa  Ricky Romero is an 62 y.o. male.  HPI: This is a 61 year old gentleman who is visiting from out of town.  He presented last evening with crampy abdominal pain, distention, nausea, and vomiting.  He has a history of having had multiple abdominal procedures including surgery for gastric ulcer, surgery for perforated diverticulitis, colostomy reversal, and surgeries for bowel obstructions.  His last bowel obstruction was in the early 2000.  He has been passing flatus and is more comfortable after nasogastric tube is been placed.  The pain was described as cramping and fairly severe right of the midline.  Past Medical History:  Diagnosis Date  . Bowel obstruction ([place])   . 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 CHOLECYSTECTOMY    . COLOSTOMY    . COLOSTOMY REVERSAL    . PROSTATECTOMY    . ulcer surgery      Family History  Problem Relation Age of Onset  . Diabetes Mellitus II Mother   . Hypertension Father   . CAD Father   . Prostate cancer Father     Social History:  reports that he has never smoked. He has never used smokeless tobacco. He reports that he does not drink alcohol or use drugs.  Allergies:  Allergies  Allergen Reactions  . Mushroom Extract Complex Anaphylaxis    Medications: I have reviewed the patient's current medications.  Results for orders placed or performed during the hospital encounter of 01/18/18 (from the past 48 hour(s))  Urinalysis, Routine w reflex microscopic     Status: Abnormal   Collection Time: 01/18/18 12:13 AM  Result Value Ref Range   Color, Urine YELLOW YELLOW   APPearance HAZY (A) CLEAR   Specific Gravity, Urine 1.021 1.005 - 1.030   pH 5.0 5.0 - 8.0   Glucose, UA NEGATIVE NEGATIVE mg/dL   Hgb urine dipstick NEGATIVE NEGATIVE   Bilirubin Urine NEGATIVE NEGATIVE   Ketones, ur NEGATIVE  NEGATIVE mg/dL   Protein, ur NEGATIVE NEGATIVE mg/dL   Nitrite NEGATIVE NEGATIVE   Leukocytes, UA NEGATIVE NEGATIVE    Comment: Performed at [facility] [address] [place], [place] [address]  Lipase, blood     Status: None   Collection Time: 01/18/18 12:20 AM  Result Value Ref Range   Lipase 28 11 - 51 U/L    Comment: Performed at [facility] [address] [place], [place] [address]  Comprehensive metabolic panel     Status: Abnormal   Collection Time: 01/18/18 12:20 AM  Result Value Ref Range   Sodium 138 135 - 145 mmol/L   Potassium 3.8 3.5 - 5.1 mmol/L   Chloride 106 101 - 111 mmol/L   CO2 22 22 - 32 mmol/L   Glucose, Bld 128 (H) 65 - 99 mg/dL   BUN 25 (H) 6 - 20 mg/dL   Creatinine, Ser 1.22 0.61 - 1.24 mg/dL   Calcium 9.2 8.9 - 10.3 mg/dL   Total Protein 7.2 6.5 - 8.1 g/dL   Albumin 4.0 3.5 - 5.0 g/dL   AST 27 15 - 41 U/L   ALT 18 17 - 63 U/L   Alkaline Phosphatase 84 38 - 126 U/L   Total Bilirubin 0.7 0.3 - 1.2 mg/dL   GFR calc non Af Amer >60 >60 mL/min   GFR calc Af Amer >60 >60  mL/min    Comment: (NOTE) The eGFR has been calculated using the CKD EPI equation. This calculation has not been validated in all clinical situations. eGFR's persistently <60 mL/min signify possible Chronic Kidney Disease.    Anion gap 10 5 - 15    Comment: Performed at [facility] [address], [place] [address]  CBC     Status: None   Collection Time: 01/18/18 12:20 AM  Result Value Ref Range   WBC 7.8 4.0 - 10.5 K/uL   RBC 4.82 4.22 - 5.81 MIL/uL   Hemoglobin 14.2 13.0 - 17.0 g/dL   HCT 42.6 39.0 - 52.0 %   MCV 88.4 78.0 - 100.0 fL   MCH 29.5 26.0 - 34.0 pg   MCHC 33.3 30.0 - 36.0 g/dL   RDW 13.7 11.5 - 15.5 %   Platelets 294 150 - 400 K/uL    Comment: Performed at [facility], [place] [address] [place], [place] [address]  Protime-INR     Status: None   Collection Time: 01/18/18  5:00 AM  Result Value Ref Range   Prothrombin Time  12.8 11.4 - 15.2 seconds   INR 0.97     Comment: Performed at [facility] [address], [place], [place] [address]  APTT     Status: None   Collection Time: 01/18/18  5:00 AM  Result Value Ref Range   aPTT 27 24 - 36 seconds    Comment: Performed at [facility] [address] [place], [place] [address]  Type and screen [place]     Status: None   Collection Time: 01/18/18  5:00 AM  Result Value Ref Range   ABO/RH(D) O NEG    Antibody Screen NEG    Sample Expiration      01/21/2018 Performed at [facility], [place] [address] [place], [place] [address]   Basic metabolic panel     Status: Abnormal   Collection Time: 01/18/18  5:00 AM  Result Value Ref Range   Sodium 137 135 - 145 mmol/L   Potassium 4.3 3.5 - 5.1 mmol/L   Chloride 109 101 - 111 mmol/L   CO2 18 (L) 22 - 32 mmol/L   Glucose, Bld 118 (H) 65 - 99 mg/dL   BUN 24 (H) 6 - 20 mg/dL   Creatinine, Ser 1.01 0.61 - 1.24 mg/dL   Calcium 8.0 (L) 8.9 - 10.3 mg/dL   GFR calc non Af Amer >60 >60 mL/min   GFR calc Af Amer >60 >60 mL/min    Comment: (NOTE) The eGFR has been calculated using the CKD EPI equation. This calculation has not been validated in all clinical situations. eGFR's persistently <60 mL/min signify possible Chronic Kidney Disease.    Anion gap 10 5 - 15    Comment: Performed at [facility] [address] [place], [place] [address]  CBC     Status: Abnormal   Collection Time: 01/18/18  5:00 AM  Result Value Ref Range   WBC 9.4 4.0 - 10.5 K/uL   RBC 4.29 4.22 - 5.81 MIL/uL   Hemoglobin 12.5 (L) 13.0 - 17.0 g/dL   HCT 37.9 (L) 39.0 - 52.0 %   MCV 88.3 78.0 - 100.0 fL   MCH 29.1 26.0 - 34.0 pg   MCHC 33.0 30.0 - 36.0 g/dL   RDW 13.5 11.5 - 15.5 %   Platelets 227 150 - 400 K/uL  Comment: Performed at [facility], [place] [address] [place], [place] [address]  ABO/Rh     Status: None (Preliminary result)   Collection Time: 01/18/18  5:00 AM  Result  Value Ref Range   ABO/RH(D)      O NEG Performed at [facility] [address] [place], [place] [address]     Ct Abdomen Pelvis W Contrast  Result Date: 01/18/2018 CLINICAL DATA:  Abdominal pain and vomiting. History of bowel obstruction with colostomy and subsequent reversal. History of bowel resection. EXAM: CT ABDOMEN AND PELVIS WITH CONTRAST TECHNIQUE: Multidetector CT imaging of the abdomen and pelvis was performed using the standard protocol following bolus administration of intravenous contrast. CONTRAST:  19m ISOVUE-300 IOPAMIDOL (ISOVUE-300) INJECTION 61% COMPARISON:  None. FINDINGS: LOWER CHEST: No basilar pulmonary nodules or pleural effusion. No apical pericardial effusion. HEPATOBILIARY: Scattered punctate hypodensities throughout the liver, too small to characterize accurately, but most likely small cysts. Status post cholecystectomy. PANCREAS: Normal parenchymal contours without ductal dilatation. No peripancreatic fluid collection. SPLEEN: Normal. ADRENALS/URINARY TRACT: --Adrenal glands: Normal. --Right kidney/ureter: Single nonobstructing renal calculus measuring 2 mm. No hydronephrosis, perinephric stranding or solid renal mass. --Left kidney/ureter: No hydronephrosis, nephroureterolithiasis, perinephric stranding or solid renal mass. --Urinary bladder: Normal for degree of distention STOMACH/BOWEL: --Stomach/Duodenum: Postsurgical changes of the stomach. Normal caliber duodenum. --Small bowel: There is moderate dilatation of the mid segment small bowel. The jejunum and distal ileum are decompressed. No definite transition point is identified but caliber change appears most abrupt in the anterior right lower quadrant. --Colon: No focal abnormality. --Appendix: Not visualized. No right lower quadrant inflammation or free fluid. VASCULAR/LYMPHATIC: Normal aorta, proximal celiac axis and proximal superior mesenteric artery. The portal vein, splenic vein, superior mesenteric vein  and IVC are patent. No abdominal or pelvic lymphadenopathy. REPRODUCTIVE: Status post prostatectomy. MUSCULOSKELETAL. No bony spinal canal stenosis or focal osseous abnormality. OTHER: None. IMPRESSION: Dilated midportion of the small bowel with fecalization of the small bowel contents, consistent with small-bowel obstruction. No definitive transition point identified, but caliber change appears to occur in the anterior right lower quadrant. The distal ileum is decompressed. Electronically Signed   By: KUlyses JarredM.D.   On: 01/18/2018 02:55    Review of Systems  Constitutional: Negative for chills and fever.  Gastrointestinal: Positive for abdominal pain, nausea and vomiting.  All other systems reviewed and are negative.  Blood pressure 123/81, pulse 87, temperature 98.4 F (36.9 C), temperature source Oral, resp. rate 16, height 5' 10"  (1.778 m), weight 83.5 kg (184 lb), SpO2 96 %. Physical Exam  Constitutional: He is oriented to person, place, and time. He appears well-developed and well-nourished. No distress.  HENT:  Head: Normocephalic and atraumatic.  Right Ear: External ear normal.  Left Ear: External ear normal.  Nose: Nose normal.  Mouth/Throat: Oropharynx is clear and moist. No oropharyngeal exudate.  Eyes: Pupils are equal, round, and reactive to light. Right eye exhibits no discharge. Left eye exhibits no discharge. No scleral icterus.  Neck: Normal range of motion. No tracheal deviation present.  Cardiovascular: Normal rate, regular rhythm, normal heart sounds and intact distal pulses.  No murmur heard. Respiratory: Effort normal and breath sounds normal. No respiratory distress. He has no wheezes. He has no rales.  GI: He exhibits distension. There is tenderness.  His abdomen is distended and mildly firm but minimally tender.  He has multiple well-healed incisions  Musculoskeletal: Normal range of motion. He exhibits no edema, tenderness or deformity.  Lymphadenopathy:     He has no cervical adenopathy.  Neurological: He is alert and oriented to person, place, and time.  Skin: Skin is warm and dry. No erythema.  Psychiatric: His behavior is normal. Judgment normal.    Assessment/Plan: Small bowel obstruction  He already feels better after nasogastric tube insertion and IV fluids.  He is already passing flatus.  The CT scan only showed one loop of significantly dilated small bowel in the central abdomen.  He does have a moderate amount of stool in his colon.  We will order the small bowel protocol and will follow along wiith the  Madysun Thall A 01/18/2018, 6:14 AM

## 2018-01-18 NOTE — Progress Notes (Addendum)
PROGRESS NOTE  Subjective: Ricky Romero is a 62 y.o. male [place] who presented 4/21 with a day of worsening abdominal pain associated with N/V and abdominal distention similar to a previous bowel obstruction. He has a history of surgery for gastric ulcer, partial colectomy for perforated diverticulitis, prostatectomy. CT on arrival showed a small SBO without transition point concerning for SBO. NG tube was placed, surgery consulted and he was admitted this AM. Small bowel protocol is underway. At the bedside, he reports to me that he is passing flatus, no BM. Nausea and abdominal pain improved significantly with starting NG tube. No further emesis.   Objective: BP 123/81 (BP Location: Left Arm)   Pulse 87   Temp 98.4 F (36.9 C) (Oral)   Resp 16   Ht 5\' 10"  (1.778 m)   Wt 83.5 kg (184 lb)   SpO2 96%   BMI 26.40 kg/m   Gen: Pleasant male in no distress HEENT: NGT in place. Oropharynx benign, no exudates or excoriations.  Pulm: Clear and nonlabored on room air  CV: RRR, no murmur, no JVD, no edema GI: Diffusely tender, distended, hypoactive BS Neuro: Alert and oriented. No focal deficits. Skin: No rashes, lesions no ulcers  Assessment & Plan: SBO: High risk for adhesions given multiple abd surgery. Hemodynamically stable, reassuringly passing gas.  - General surgery following, on SBO protocol. Mildly improved on XR this AM - NPO, NGT, IVF - Chloraseptic spray for throat irritation.   AKI: Possible. SCr 1.22, improved to 1.01. Unknown baseline.  - Continue IVF's, monitor BPM in AM.   Normocytic anemia: Revealed after IVF's.  - Monitor CBC in AM  Ricky Junkeryan Alek Borges, MD [facility] Pager [phone] 01/18/2018, 2:57 PM

## 2018-01-18 NOTE — ED Provider Notes (Signed)
Patient placed in Quick Look pathway, seen and evaluated   Chief Complaint: abdominal pain, n/v  HPI:   Wynelle LinkGary Perl is a 62 y.o. male [place] who present to the ED with abdominal pain, n/v. Patient reports hx of small bowel obstruction multiple times and surgery with bowel resection and colostomy that has since been reversed. Patient reports his abdomen feels firm and the pain woke him tonight. The pain is generalized and patient reports vomiting multiple times.   ROS: GI: n/v, abdominal pain  Physical Exam:    BP 102/71 (BP Location: Left Arm)   Pulse 79   Temp 97.8 F (36.6 C) (Oral)   Resp 18   Ht 5\' 10"  (1.778 m)   Wt 83.5 kg (184 lb)   SpO2 98%   BMI 26.40 kg/m    Gen: appears very uncomfortable  Neuro: Awake and Alert  Skin: pale  Abdomen: distended, generalized tenderness     Initiation of care has begun. The patient has been counseled on the process, plan, and necessity for staying for the completion/evaluation, and the remainder of the medical screening examination    Janne Napoleoneese, Hope M, NP 01/18/18 [id number]    Derwood KaplanNanavati, Ankit, MD 01/20/18 [phone]

## 2018-01-18 NOTE — H&P (Signed)
History and Physical    Ricky Romero ZOX:[id number] DOB: 1956-03-28 DOA: 01/18/2018  Referring MD/NP/PA:   PCP: Patient, No Pcp Per   Patient coming from:  The patient is coming from home.  At baseline, pt is independent for most of ADL.   Chief Complaint: nausea, vomiting, abdominal pain, abdominal distention  HPI: Ricky Romero is a 62 y.o. male with medical history significant of duodenal ulcer (s/p of gastro-jejunal bypass), small bowel obstruction, partial small bowel resection, HLD, who presents with nausea, vomiting, abdominal pain and abdominal distention.  Patient states that his symptoms started at the evening. He has nausea and vomited 4 times without blood in the vomitus. His abdominal is a detected. He has abdominal pain, which is a diffuse, constant, sharp, 8 out of 10 in severity, nonradiating. His last bowel movement was last evening. No fever or chills. Denies symptoms of UTI. No chest pain, cough, shortness breath. No unilateral weakness.  ED Course: pt was found to have WBC 7.8, acute renal injury with creatinine 1.22, BUN 25, negative urinalysis, temperature normal, no tachycardia, oxygen saturation 96% on room air. CT abdomen/pelvis that showed small bowel obstruction. Patient is admitted to MedSurg bed as inpatient. Gen. Surgeon, Dr. Rayburn Ma was consulted.  Review of Systems:   General: no fevers, chills, no body weight gain, has fatigue HEENT: no blurry vision, hearing changes or sore throat Respiratory: no dyspnea, coughing, wheezing CV: no chest pain, no palpitations GI: has nausea, vomiting, abdominal pain, no diarrhea, constipation GU: no dysuria, burning on urination, increased urinary frequency, hematuria  Ext: no leg edema Neuro: no unilateral weakness, numbness, or tingling, no vision change or hearing loss Skin: no rash, no skin tear. MSK: No muscle spasm, no deformity, no limitation of range of movement in spin Heme: No easy bruising.  Travel history: No  recent long distant travel.  Allergy:  Allergies  Allergen Reactions  . Mushroom Extract Complex Anaphylaxis    Past Medical History:  Diagnosis Date  . Bowel obstruction ([facility])   . 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 CHOLECYSTECTOMY    . COLOSTOMY    . COLOSTOMY REVERSAL    . PROSTATECTOMY    . ulcer surgery      Social History:  reports that he has never smoked. He has never used smokeless tobacco. He reports that he does not drink alcohol or use drugs.  Family History:  Family History  Problem Relation Age of Onset  . Diabetes Mellitus II Mother   . Hypertension Father   . CAD Father   . Prostate cancer Father      Prior to Admission medications   Medication Sig Start Date End Date Taking? Authorizing Provider  Multiple Vitamin (MULTIVITAMIN WITH MINERALS) TABS tablet Take 1 tablet by mouth daily.   Yes [provider]  polyethylene glycol (MIRALAX / GLYCOLAX) packet Take 17 g by mouth 2 (two) times daily.   Yes [provider]  Probiotic Product (PROBIOTIC PO) Take 1 tablet by mouth every 3 (three) days.   Yes [provider]    Physical Exam: Vitals:   01/18/18 0400 01/18/18 0415 01/18/18 0445 01/18/18 0537  BP: 124/78 (!) 115/98 125/75 123/81  Pulse: (!) 136 82 87   Resp: 14 17 14 16   Temp:    98.4 F (36.9 C)  TempSrc:    Oral  SpO2: 94% 93% 96% 96%  Weight:  Height:       General: Not in acute distress HEENT:       Eyes: PERRL, EOMI, no scleral icterus.       ENT: No discharge from the ears and nose, no pharynx injection, no tonsillar enlargement.        Neck: No JVD, no bruit, no mass felt. Heme: No neck lymph node enlargement. Cardiac: S1/S2, RRR, No murmurs, No gallops or rubs. Respiratory: No rales, wheezing, rhonchi or rubs. GI: distended, diffused tenderness, no rebound pain, no organomegaly, BS present. GU: No hematuria Ext: No  pitting leg edema bilaterally. 2+DP/PT pulse bilaterally. Musculoskeletal: No joint deformities, No joint redness or warmth, no limitation of ROM in spin. Skin: No rashes.  Neuro: Alert, oriented X3, cranial nerves II-XII grossly intact, moves all extremities normally. Psych: Patient is not psychotic, no suicidal or hemocidal ideation.  Labs on Admission: I have personally reviewed following labs and imaging studies  CBC: Recent Labs  Lab 01/18/18 0020 01/18/18 0500  WBC 7.8 9.4  HGB 14.2 12.5*  HCT 42.6 37.9*  MCV 88.4 88.3  PLT 294 227   Basic Metabolic Panel: Recent Labs  Lab 01/18/18 0020 01/18/18 0500  NA 138 137  K 3.8 4.3  CL 106 109  CO2 22 18*  GLUCOSE 128* 118*  BUN 25* 24*  CREATININE 1.22 1.01  CALCIUM 9.2 8.0*   GFR: Estimated Creatinine Clearance: 77.3 mL/min (by C-G formula based on SCr of 1.01 mg/dL). Liver Function Tests: Recent Labs  Lab 01/18/18 0020  AST 27  ALT 18  ALKPHOS 84  BILITOT 0.7  PROT 7.2  ALBUMIN 4.0   Recent Labs  Lab 01/18/18 0020  LIPASE 28   No results for input(s): AMMONIA in the last 168 hours. Coagulation Profile: Recent Labs  Lab 01/18/18 0500  INR 0.97   Cardiac Enzymes: No results for input(s): CKTOTAL, CKMB, CKMBINDEX, TROPONINI in the last 168 hours. BNP (last 3 results) No results for input(s): PROBNP in the last 8760 hours. HbA1C: No results for input(s): HGBA1C in the last 72 hours. CBG: No results for input(s): GLUCAP in the last 168 hours. Lipid Profile: No results for input(s): CHOL, HDL, LDLCALC, TRIG, CHOLHDL, LDLDIRECT in the last 72 hours. Thyroid Function Tests: No results for input(s): TSH, T4TOTAL, FREET4, T3FREE, THYROIDAB in the last 72 hours. Anemia Panel: No results for input(s): VITAMINB12, FOLATE, FERRITIN, TIBC, IRON, RETICCTPCT in the last 72 hours. Urine analysis:    Component Value Date/Time   COLORURINE YELLOW 01/18/2018 0013   APPEARANCEUR HAZY (A) 01/18/2018 0013    LABSPEC 1.021 01/18/2018 0013   PHURINE 5.0 01/18/2018 0013   GLUCOSEU NEGATIVE 01/18/2018 0013   HGBUR NEGATIVE 01/18/2018 0013   BILIRUBINUR NEGATIVE 01/18/2018 0013   KETONESUR NEGATIVE 01/18/2018 0013   PROTEINUR NEGATIVE 01/18/2018 0013   NITRITE NEGATIVE 01/18/2018 0013   LEUKOCYTESUR NEGATIVE 01/18/2018 0013   Sepsis Labs: @LABRCNTIP (procalcitonin:4,lacticidven:4) )No results found for this or any previous visit (from the past 240 hour(s)).   Radiological Exams on Admission: Ct Abdomen Pelvis W Contrast  Result Date: 01/18/2018 CLINICAL DATA:  Abdominal pain and vomiting. History of bowel obstruction with colostomy and subsequent reversal. History of bowel resection. EXAM: CT ABDOMEN AND PELVIS WITH CONTRAST TECHNIQUE: Multidetector CT imaging of the abdomen and pelvis was performed using the standard protocol following bolus administration of intravenous contrast. CONTRAST:  100mL ISOVUE-300 IOPAMIDOL (ISOVUE-300) INJECTION 61% COMPARISON:  None. FINDINGS: LOWER CHEST: No basilar pulmonary nodules or pleural effusion. No apical pericardial  effusion. HEPATOBILIARY: Scattered punctate hypodensities throughout the liver, too small to characterize accurately, but most likely small cysts. Status post cholecystectomy. PANCREAS: Normal parenchymal contours without ductal dilatation. No peripancreatic fluid collection. SPLEEN: Normal. ADRENALS/URINARY TRACT: --Adrenal glands: Normal. --Right kidney/ureter: Single nonobstructing renal calculus measuring 2 mm. No hydronephrosis, perinephric stranding or solid renal mass. --Left kidney/ureter: No hydronephrosis, nephroureterolithiasis, perinephric stranding or solid renal mass. --Urinary bladder: Normal for degree of distention STOMACH/BOWEL: --Stomach/Duodenum: Postsurgical changes of the stomach. Normal caliber duodenum. --Small bowel: There is moderate dilatation of the mid segment small bowel. The jejunum and distal ileum are decompressed. No  definite transition point is identified but caliber change appears most abrupt in the anterior right lower quadrant. --Colon: No focal abnormality. --Appendix: Not visualized. No right lower quadrant inflammation or free fluid. VASCULAR/LYMPHATIC: Normal aorta, proximal celiac axis and proximal superior mesenteric artery. The portal vein, splenic vein, superior mesenteric vein and IVC are patent. No abdominal or pelvic lymphadenopathy. REPRODUCTIVE: Status post prostatectomy. MUSCULOSKELETAL. No bony spinal canal stenosis or focal osseous abnormality. OTHER: None. IMPRESSION: Dilated midportion of the small bowel with fecalization of the small bowel contents, consistent with small-bowel obstruction. No definitive transition point identified, but caliber change appears to occur in the anterior right lower quadrant. The distal ileum is decompressed. Electronically Signed   By: Deatra Robinson M.D.   On: 01/18/2018 02:55     EKG:  Not done in ED.  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 Patient is not septic hemodynamically stable. No leukocytosis or fever. Gen. Surgeon, Dr. Vedia Coffer male was consulted.  -Admit to med surg bed -NPO   -NG tube  -morphine prn pain -Prn Zofran prn nausea   -IVF: 1L of NS, then 125 cc/h -INR/PTT/type & screen -Follow-up general surgeons recommendation  AKI (acute kidney injury) ([facility]CC): mild, Cre 1.22 and BUN.  Likely due to prerenal secondary to dehydration - IVF as above - Follow up renal function by BMP   DVT ppx: SCD Code Status: Full code Family Communication: None at bed side.    Disposition Plan:  Anticipate discharge back to previous home environment Consults called:  Dr. Vedia Coffer male for general surgeon Admission status:  medical  floor/inpt  Date of Service 01/18/2018    Lorretta Harp [facility] Pager [phone]  If 7PM-7AM, please contact night-coverage [email or web address] Password [facility] 01/18/2018, 5:59 AM

## 2018-01-18 NOTE — ED Provider Notes (Addendum)
[facility] EMERGENCY DEPARTMENT Provider Note   CSN: [id number] Arrival date & time: 01/18/18  0007     History   Chief Complaint Chief Complaint  Patient presents with  . Emesis    HPI Ricky Romero is a 62 y.o. male with a PMHx of HLD, diverticulosis, gastric ulcer s/p jejunectomy and vegectomy, and multiple bowel surgeries including for perforated diverticulum as well as SBO s/p resection of bowel and colostomy (which was later reversed), lysis of adhesions, appendectomy, cholecystectomy, and b/l inguinal hernia repairs, who presents to the ED with complaints of sudden onset abdominal pain, distention, nausea, and vomiting that began around 5 PM after eating dinner.  Patient states that yesterday he had some mild "gurgling" and transient discomfort that went away, but he states that he felt full most of the day.  Today he felt all right until 5 PM when he ate dinner including hot dogs, cats, and potato salad.  Afterwards he started having abdominal distention and firmness, and abdominal pain which he describes as 9/10 constant crampy nonradiating midline abdominal pain, with no known aggravating factors, and unrelieved with Tums, although relieved after vomiting.  He had associated nausea and 4 episodes of nonbloody nonbilious emesis upon arrival.  He denies fevers, chills, CP, SOB, diarrhea/constipation, obstipation, melena, hematochezia, hematemesis, hematuria, dysuria, myalgias, arthralgias, numbness, tingling, focal weakness, or any other complaints at this time. Denies recent foreign travel, sick contacts, suspicious food intake, EtOH use, or frequent NSAID use.  He is from [place], here on vacation; everyone else that ate dinner with him and had the same food were all fine and without the same symptoms.  He denies having any medical conditions that he's aware of, other than GI issues listed above, and HLD for which he does not take any medications.      Past Medical  History:  Diagnosis Date  . Bowel obstruction (HCC)     There are no active problems to display for this patient.   Past Surgical History:  Procedure Laterality Date  . CHOLECYSTECTOMY    . COLOSTOMY    . COLOSTOMY REVERSAL    . PROSTATECTOMY    . ulcer surgery          Home Medications    Prior to Admission medications   Medication Sig Start Date End Date Taking? Authorizing Provider  Multiple Vitamin (MULTIVITAMIN WITH MINERALS) TABS tablet Take 1 tablet by mouth daily.   Yes [provider]  polyethylene glycol (MIRALAX / GLYCOLAX) packet Take 17 g by mouth 2 (two) times daily.   Yes [provider]  Probiotic Product (PROBIOTIC PO) Take 1 tablet by mouth every 3 (three) days.   Yes [provider]    Family History No family history on file.  Social History Social History   Tobacco Use  . Smoking status: Never Smoker  . Smokeless tobacco: Never Used  Substance Use Topics  . Alcohol use: Never    Frequency: Never  . Drug use: Never     Allergies   Mushroom extract complex   Review of Systems Review of Systems  Constitutional: Negative for chills and fever.  Respiratory: Negative for shortness of breath.   Cardiovascular: Negative for chest pain.  Gastrointestinal: Positive for abdominal distention, abdominal pain, nausea and vomiting. Negative for blood in stool, constipation and diarrhea.  Genitourinary: Negative for dysuria and hematuria.  Musculoskeletal: Negative for arthralgias and myalgias.  Skin: Negative for color change.  Allergic/Immunologic: Negative for immunocompromised state.  Neurological: Negative for weakness and numbness.  Psychiatric/Behavioral: Negative for confusion.   All other systems reviewed and are negative for acute change except as noted in the HPI.    Physical Exam Updated Vital Signs BP 102/71 (BP Location: Left Arm)   Pulse 79   Temp 97.8 F (36.6 C) (Oral)   Resp 18   Ht 5\' 10"  (1.778  m)   Wt 83.5 kg (184 lb)   SpO2 98%   BMI 26.40 kg/m   Physical Exam  Constitutional: He is oriented to person, place, and time. Vital signs are normal. He appears well-developed and well-nourished.  Non-toxic appearance. No distress.  Afebrile, nontoxic, NAD  HENT:  Head: Normocephalic and atraumatic.  Mouth/Throat: Oropharynx is clear and moist and mucous membranes are normal.  Eyes: Conjunctivae and EOM are normal. Right eye exhibits no discharge. Left eye exhibits no discharge.  Neck: Normal range of motion. Neck supple.  Cardiovascular: Normal rate, regular rhythm, normal heart sounds and intact distal pulses. Exam reveals no gallop and no friction rub.  No murmur heard. Pulmonary/Chest: Effort normal and breath sounds normal. No respiratory distress. He has no decreased breath sounds. He has no wheezes. He has no rhonchi. He has no rales.  Abdominal: Soft. Normal appearance. He exhibits distension. Bowel sounds are decreased. There is tenderness in the right lower quadrant, suprapubic area and left lower quadrant. There is no rigidity, no rebound, no guarding, no CVA tenderness, no tenderness at McBurney's point and negative Murphy's sign.  Soft although mildly distended in the lower abdomen, with hypoactive bowel sounds in the lower abdominal quadrants, moderately TTP in the lower abdomen, no r/g/r, neg murphy's, neg mcburney's, no CVA TTP   Musculoskeletal: Normal range of motion.  Neurological: He is alert and oriented to person, place, and time. He has normal strength. No sensory deficit.  Skin: Skin is warm, dry and intact. No rash noted.  Psychiatric: He has a normal mood and affect.  Nursing note and vitals reviewed.    ED Treatments / Results  Labs (all labs ordered are listed, but only abnormal results are displayed) Labs Reviewed  COMPREHENSIVE METABOLIC PANEL - Abnormal; Notable for the following components:      Result Value   Glucose, Bld 128 (*)    BUN 25 (*)     All other components within normal limits  URINALYSIS, ROUTINE W REFLEX MICROSCOPIC - Abnormal; Notable for the following components:   APPearance HAZY (*)    All other components within normal limits  LIPASE, BLOOD  CBC    EKG None  Radiology Ct Abdomen Pelvis W Contrast  Result Date: 01/18/2018 CLINICAL DATA:  Abdominal pain and vomiting. History of bowel obstruction with colostomy and subsequent reversal. History of bowel resection. EXAM: CT ABDOMEN AND PELVIS WITH CONTRAST TECHNIQUE: Multidetector CT imaging of the abdomen and pelvis was performed using the standard protocol following bolus administration of intravenous contrast. CONTRAST:  100mL ISOVUE-300 IOPAMIDOL (ISOVUE-300) INJECTION 61% COMPARISON:  None. FINDINGS: LOWER CHEST: No basilar pulmonary nodules or pleural effusion. No apical pericardial effusion. HEPATOBILIARY: Scattered punctate hypodensities throughout the liver, too small to characterize accurately, but most likely small cysts. Status post cholecystectomy. PANCREAS: Normal parenchymal contours without ductal dilatation. No peripancreatic fluid collection. SPLEEN: Normal. ADRENALS/URINARY TRACT: --Adrenal glands: Normal. --Right kidney/ureter: Single nonobstructing renal calculus measuring 2 mm. No hydronephrosis, perinephric stranding or solid renal mass. --Left kidney/ureter: No hydronephrosis, nephroureterolithiasis, perinephric stranding or solid renal mass. --Urinary bladder: Normal for degree of distention STOMACH/BOWEL: --  Stomach/Duodenum: Postsurgical changes of the stomach. Normal caliber duodenum. --Small bowel: There is moderate dilatation of the mid segment small bowel. The jejunum and distal ileum are decompressed. No definite transition point is identified but caliber change appears most abrupt in the anterior right lower quadrant. --Colon: No focal abnormality. --Appendix: Not visualized. No right lower quadrant inflammation or free fluid. VASCULAR/LYMPHATIC:  Normal aorta, proximal celiac axis and proximal superior mesenteric artery. The portal vein, splenic vein, superior mesenteric vein and IVC are patent. No abdominal or pelvic lymphadenopathy. REPRODUCTIVE: Status post prostatectomy. MUSCULOSKELETAL. No bony spinal canal stenosis or focal osseous abnormality. OTHER: None. IMPRESSION: Dilated midportion of the small bowel with fecalization of the small bowel contents, consistent with small-bowel obstruction. No definitive transition point identified, but caliber change appears to occur in the anterior right lower quadrant. The distal ileum is decompressed. Electronically Signed   By: Deatra Robinson M.D.   On: 01/18/2018 02:55    Procedures Procedures (including critical care time)  Medications Ordered in ED Medications  iopamidol (ISOVUE-300) 61 % injection (has no administration in time range)  ondansetron (ZOFRAN-ODT) disintegrating tablet 4 mg (4 mg Oral Given 01/18/18 0020)  sodium chloride 0.9 % bolus 1,000 mL (1,000 mLs Intravenous New Bag/Given 01/18/18 0234)  ondansetron (ZOFRAN) injection 4 mg (4 mg Intravenous Given 01/18/18 0233)  morphine 4 MG/ML injection 4 mg (4 mg Intravenous Given 01/18/18 0233)  iopamidol (ISOVUE-300) 61 % injection 100 mL (100 mLs Intravenous Contrast Given 01/18/18 0220)     Initial Impression / Assessment and Plan / ED Course  I have reviewed the triage vital signs and the nursing notes.  Pertinent labs & imaging results that were available during my care of the patient were reviewed by me and considered in my medical decision making (see chart for details).     62 y.o. male here with abdominal distention, abdominal pain, nausea, and vomiting that began just after eating dinner around 5 PM.  He has a history of multiple abdominal surgeries including ex lap for small bowel obstruction with resection of a portion of his bowels and later reversal of colostomy.  On exam, mild abdominal distention particularly in the  lower abdomen, moderate lower abdominal tenderness, slightly hypoactive bowel sounds in the lower abdomen, no rebound/guarding/rigidity.  Vital signs stable.  Workup thus far reveals: Lipase WNL; CMP with mildly elevated BUN at 25 and creatinine borderline at 1.22, unclear what his baseline is; CBC WNL; U/A WNL. DDx includes obstruction vs perf vs diverticulitis, etc. Will obtain CT abd/pelv, give fluids and pain meds/antiemetics, and reassess shortly.   3:20 AM CT Abd/pelv confirming that he has a small bowel obstruction, no definite transition point but change of caliber in anterior RLQ is seen. NGT ordered, will consult surgeon. Pt comfortable and has no concerns/questions at this time. Discussed case with my attending Dr. Rhunette Croft who agrees with plan.   3:26 AM Dr. Magnus Ivan of surgery returning page, will consult on pt while he's here but he requests hospitalist/medical admission. Will proceed with medical consult for admission.   3:45 AM Dr. Clyde Lundborg of [facility] returning page and will admit. Holding orders to be placed by admitting team. Please see their notes for further documentation of care. I appreciate their help with this pleasant pt's care. Pt stable at time of admission.    Final Clinical Impressions(s) / ED Diagnoses   Final diagnoses:  SBO (small bowel obstruction) (HCC)  Generalized abdominal pain  Nausea and vomiting in adult patient  ED Discharge Orders    [address] [place], [place] 01/18/18 1610    Derwood Kaplan, MD 01/20/18 [phone]

## 2018-01-19 ENCOUNTER — Other Ambulatory Visit: Payer: Self-pay

## 2018-01-19 DIAGNOSIS — D649 Anemia, unspecified: Secondary | ICD-10-CM

## 2018-01-19 LAB — BASIC METABOLIC PANEL
ANION GAP: 8 (ref 5–15)
BUN: 12 mg/dL (ref 6–20)
CALCIUM: 8.2 mg/dL — AB (ref 8.9–10.3)
CO2: 21 mmol/L — ABNORMAL LOW (ref 22–32)
Chloride: 110 mmol/L (ref 101–111)
Creatinine, Ser: 1.05 mg/dL (ref 0.61–1.24)
GLUCOSE: 107 mg/dL — AB (ref 65–99)
Potassium: 3.4 mmol/L — ABNORMAL LOW (ref 3.5–5.1)
SODIUM: 139 mmol/L (ref 135–145)

## 2018-01-19 LAB — CBC
HCT: 35.8 % — ABNORMAL LOW (ref 39.0–52.0)
HEMOGLOBIN: 11.5 g/dL — AB (ref 13.0–17.0)
MCH: 28.9 pg (ref 26.0–34.0)
MCHC: 32.1 g/dL (ref 30.0–36.0)
MCV: 89.9 fL (ref 78.0–100.0)
Platelets: 239 10*3/uL (ref 150–400)
RBC: 3.98 MIL/uL — ABNORMAL LOW (ref 4.22–5.81)
RDW: 13.8 % (ref 11.5–15.5)
WBC: 8 10*3/uL (ref 4.0–10.5)

## 2018-01-19 LAB — GLUCOSE, CAPILLARY: GLUCOSE-CAPILLARY: 86 mg/dL (ref 65–99)

## 2018-01-19 MED ORDER — LOPERAMIDE HCL 2 MG PO CAPS
4.0000 mg | ORAL_CAPSULE | ORAL | Status: DC | PRN
  Administered 2018-01-20: 4 mg via ORAL
  Filled 2018-01-19: qty 2

## 2018-01-19 NOTE — Progress Notes (Signed)
Subjective/Chief Complaint: Has had 3 liquid bowel movements. Continues to pass flatus.    Objective: Vital signs in last 24 hours: Temp:  [98.1 F (36.7 C)-98.6 F (37 C)] 98.6 F (37 C) (04/22 0525) Pulse Rate:  [70-82] 82 (04/22 0525) Resp:  [16] 16 (04/22 0525) BP: (134-146)/(75-84) 146/80 (04/22 0525) SpO2:  [96 %-98 %] 96 % (04/22 0525) Last BM Date: 01/18/18  Intake/Output from previous day: 04/21 0701 - 04/22 0700 In: 1033.3 [I.V.:983.3; NG/GT:50] Out: 650 [Emesis/NG output:650] Intake/Output this shift: No intake/output data recorded.  General appearance: alert and cooperative Resp: clear to auscultation bilaterally Cardio: regular rate and rhythm GI: soft, nontender, nondistended Skin: Skin color, texture, turgor normal. No rashes or lesions  Lab Results:  Recent Labs    01/18/18 0500 01/19/18 0537  WBC 9.4 8.0  HGB 12.5* 11.5*  HCT 37.9* 35.8*  PLT 227 239   BMET Recent Labs    01/18/18 0500 01/19/18 0537  NA 137 139  K 4.3 3.4*  CL 109 110  CO2 18* 21*  GLUCOSE 118* 107*  BUN 24* 12  CREATININE 1.01 1.05  CALCIUM 8.0* 8.2*   PT/INR Recent Labs    01/18/18 0500  LABPROT 12.8  INR 0.97   ABG No results for input(s): PHART, HCO3 in the last 72 hours.  Invalid input(s): PCO2, PO2  Studies/Results: Ct Abdomen Pelvis W Contrast  Result Date: 01/18/2018 CLINICAL DATA:  Abdominal pain and vomiting. History of bowel obstruction with colostomy and subsequent reversal. History of bowel resection. EXAM: CT ABDOMEN AND PELVIS WITH CONTRAST TECHNIQUE: Multidetector CT imaging of the abdomen and pelvis was performed using the standard protocol following bolus administration of intravenous contrast. CONTRAST:  ISOVUE-300 IOPAMIDOL (ISOVUE-300) INJECTION 61% COMPARISON:  None. FINDINGS: LOWER CHEST: No basilar pulmonary nodules or pleural effusion. No apical pericardial effusion. HEPATOBILIARY: Scattered punctate hypodensities throughout the  liver, too small to characterize accurately, but most likely small cysts. Status post cholecystectomy. PANCREAS: Normal parenchymal contours without ductal dilatation. No peripancreatic fluid collection. SPLEEN: Normal. ADRENALS/URINARY TRACT: --Adrenal glands: Normal. --Right kidney/ureter: Single nonobstructing renal calculus measuring 2 mm. No hydronephrosis, perinephric stranding or solid renal mass. --Left kidney/ureter: No hydronephrosis, nephroureterolithiasis, perinephric stranding or solid renal mass. --Urinary bladder: Normal for degree of distention STOMACH/BOWEL: --Stomach/Duodenum: Postsurgical changes of the stomach. Normal caliber duodenum. --Small bowel: There is moderate dilatation of the mid segment small bowel. The jejunum and distal ileum are decompressed. No definite transition point is identified but caliber change appears most abrupt in the anterior right lower quadrant. --Colon: No focal abnormality. --Appendix: Not visualized. No right lower quadrant inflammation or free fluid. VASCULAR/LYMPHATIC: Normal aorta, proximal celiac axis and proximal superior mesenteric artery. The portal vein, splenic vein, superior mesenteric vein and IVC are patent. No abdominal or pelvic lymphadenopathy. REPRODUCTIVE: Status post prostatectomy. MUSCULOSKELETAL. No bony spinal canal stenosis or focal osseous abnormality. OTHER: None. IMPRESSION: Dilated midportion of the small bowel with fecalization of the small bowel contents, consistent with small-bowel obstruction. No definitive transition point identified, but caliber change appears to occur in the anterior right lower quadrant. The distal ileum is decompressed. Electronically Signed   By: Deatra Robinson M.D.   On: 01/18/2018 02:55   Dg Abd Portable 1v-small Bowel Obstruction Protocol-initial, 8 Hr Delay  Result Date: 01/18/2018 CLINICAL DATA:  8 hour delay film on small-bowel obstruction EXAM: PORTABLE ABDOMEN - 1 VIEW COMPARISON:  Film from earlier in  the same day. FINDINGS: Nasogastric catheter is again identified. The previously  administered contrast material now lies within the colon. No obstructive changes in the small bowel are identified. IMPRESSION: Contrast material within the colon at 8 hours. The degree of small bowel dilatation has improved from the prior exam. Electronically Signed   By: Alcide CleverMark  Lukens M.D.   On: 01/18/2018 17:25   Dg Abd Portable 1v  Result Date: 01/18/2018 CLINICAL DATA:  Nasogastric tube placement. EXAM: PORTABLE ABDOMEN - 1 VIEW COMPARISON:  Abdomen and pelvis CT obtained earlier today. FINDINGS: Nasogastric tube tip in the proximal stomach. The side hole is in the region of the gastroesophageal junction. Mildly prominent small bowel loops in the mid abdomen. Bilateral pelvic surgical clips. Bilateral upper abdominal surgical clips and staples. Mild scoliosis. Lumbar spine degenerative changes. IMPRESSION: 1. Nasogastric tube tip in the proximal stomach and side hole in the region of the gastroesophageal junction. 2. Mildly improved pattern of small bowel obstruction. Electronically Signed   By: Beckie SaltsSteven  Reid M.D.   On: 01/18/2018 08:48    Anti-infectives: Anti-infectives (From admission, onward)   None      Assessment/Plan: SBO Clinically improving Contrast reached colon within 8 hours on SBO protocol films. Remove NG, start clears this AM.   LOS: 1 day    Berna BueChelsea A Amberlie Gaillard 01/19/2018

## 2018-01-19 NOTE — Progress Notes (Signed)
PROGRESS NOTE  Subjective: Ricky Romero is a 62 y.o. [place] who presented 4/21 with a day of worsening abdominal pain associated with N/V and abdominal distention similar to a previous bowel obstruction. He has a history of surgery for gastric ulcer, partial colectomy for perforated diverticulitis, prostatectomy. CT on arrival showed a small SBO without transition point concerning for SBO. NG tube was placed, surgery consulted and he was admitted 4/21. Repeat XR showed contrast into colon and no further evidence of obstruction. NG tube removed and he has tolerated clears on 4/22.  Reports no abdominal pain, no nausea since removing NG tube. Sore throat resolved. Had diarrhea. Wants to go home today.   Objective: BP (!) 146/80 (BP Location: Left Arm)   Pulse 82   Temp 98.6 F (37 C) (Oral)   Resp 16   Ht 5\' 10"  (1.778 m)   Wt 83.5 kg (184 lb)   SpO2 96%   BMI 26.40 kg/m   Gen: Pleasant male in no distress HEENT: NGT out. Oropharynx clear Pulm: Clear and nonlabored on room air  CV: RRR, no murmur, no JVD, no edema GI: Normal bowel sounds, active, no tenderness or distention Neuro: Alert and oriented. No focal deficits. Skin: No rashes, lesions no ulcers  Assessment & Plan: SBO: High risk for adhesions given multiple abd surgery. Hemodynamically stable, appears to be in early stage of resolution.  - Removed NG tube, tolerating clears. Advance diet per general surgery. DC IVF.   SCr 1.22 > 1.05 NOT consistent with AKI or CKD.  - DC IVF - Continue to avoid nephrotoxins as able.   Normocytic anemia: Unclear baseline.  - No evidence of bleeding. Check CBC in AM to verify stability.   Hazeline Junkeryan Niobe Dick, MD [facility] Pager [phone] 01/19/2018, 11:05 AM

## 2018-01-20 DIAGNOSIS — D649 Anemia, unspecified: Secondary | ICD-10-CM

## 2018-01-20 DIAGNOSIS — K56609 Unspecified intestinal obstruction, unspecified as to partial versus complete obstruction: Secondary | ICD-10-CM

## 2018-01-20 LAB — CBC
HEMATOCRIT: 35.3 % — AB (ref 39.0–52.0)
HEMOGLOBIN: 11.4 g/dL — AB (ref 13.0–17.0)
MCH: 28.8 pg (ref 26.0–34.0)
MCHC: 32.3 g/dL (ref 30.0–36.0)
MCV: 89.1 fL (ref 78.0–100.0)
PLATELETS: 230 10*3/uL (ref 150–400)
RBC: 3.96 MIL/uL — AB (ref 4.22–5.81)
RDW: 13.4 % (ref 11.5–15.5)
WBC: 6.4 10*3/uL (ref 4.0–10.5)

## 2018-01-20 LAB — GLUCOSE, CAPILLARY: Glucose-Capillary: 89 mg/dL (ref 65–99)

## 2018-01-20 NOTE — Progress Notes (Signed)
Provided discharge education/instructions, all questions and concerns addressed, discharged home with belongings accompanied by family members.

## 2018-01-20 NOTE — Progress Notes (Signed)
   Subjective/Chief Complaint: Tolerating fulls, having bowel function   Objective: Vital signs in last 24 hours: Temp:  [97.9 F (36.6 C)-98.2 F (36.8 C)] 97.9 F (36.6 C) (04/23 0603) Pulse Rate:  [63-71] 63 (04/23 0603) Resp:  [15-17] 15 (04/23 0603) BP: (117-135)/(73-87) 117/73 (04/23 0603) SpO2:  [96 %-99 %] 96 % (04/23 0603) Last BM Date: 01/19/18  Intake/Output from previous day: 04/22 0701 - 04/23 0700 In: 1440 [P.O.:1440] Out: -  Intake/Output this shift: No intake/output data recorded.  General appearance: alert and cooperative Resp: clear to auscultation bilaterally Cardio: regular rate and rhythm GI: soft, nontender, nondistended Skin: Skin color, texture, turgor normal. No rashes or lesions  Lab Results:  Recent Labs    01/18/18 0500 01/19/18 0537  WBC 9.4 8.0  HGB 12.5* 11.5*  HCT 37.9* 35.8*  PLT 227 239   BMET Recent Labs    01/18/18 0500 01/19/18 0537  NA 137 139  K 4.3 3.4*  CL 109 110  CO2 18* 21*  GLUCOSE 118* 107*  BUN 24* 12  CREATININE 1.01 1.05  CALCIUM 8.0* 8.2*   PT/INR Recent Labs    01/18/18 0500  LABPROT 12.8  INR 0.97   ABG No results for input(s): PHART, HCO3 in the last 72 hours.  Invalid input(s): PCO2, PO2  Studies/Results: Dg Abd Portable 1v-small Bowel Obstruction Protocol-initial, 8 Hr Delay  Result Date: 01/18/2018 CLINICAL DATA:  8 hour delay film on small-bowel obstruction EXAM: PORTABLE ABDOMEN - 1 VIEW COMPARISON:  Film from earlier in the same day. FINDINGS: Nasogastric catheter is again identified. The previously administered contrast material now lies within the colon. No obstructive changes in the small bowel are identified. IMPRESSION: Contrast material within the colon at 8 hours. The degree of small bowel dilatation has improved from the prior exam. Electronically Signed   By: Alcide CleverMark  Lukens M.D.   On: 01/18/2018 17:25    Anti-infectives: Anti-infectives (From admission, onward)   None       Assessment/Plan: SBO Clinically improving Regular diet  OK for discharge from surgery standpoint   LOS: 2 days    Ricky Romero 01/20/2018

## 2018-01-20 NOTE — Discharge Summary (Signed)
Physician Discharge Summary  Ricky LinkGary Goslin ZOX:[id number] DOB: 07-01-1956 DOA: 01/18/2018  PCP: No primary care provider on file.  Admit date: 01/18/2018 Discharge date: 01/20/2018  Admitted From: Home Disposition: Home   Recommendations for Outpatient Follow-up:  1. Follow up with PCP in 1-2 weeks  Home Health: None Equipment/Devices: None Discharge Condition: Stable CODE STATUS: Full Diet recommendation: Soft, advance as tolerated  Brief/Interim Summary: Ricky Romero is a 62 y.o. [place] who presented 4/21 with a day of worsening abdominal pain associated with N/V and abdominal distention similar to a previous bowel obstruction. He has a history of surgery for gastric ulcer, partial colectomy for perforated diverticulitis, prostatectomy. CT on arrival showed a small SBO without transition point concerning for SBO. NG tube was placed, surgery consulted and he was admitted 4/21. Repeat XR showed contrast into colon and no further evidence of obstruction. NG tube removed and he has tolerated clears on 4/22, advanced to full liquids with continued resolution of symptoms. General surgery has cleared for discharge.   Discharge Diagnoses:  Principal Problem:   SBO (small bowel obstruction) (HCC) Active Problems:   AKI (acute kidney injury) (HCC)   Normocytic anemia  SBO: High risk for adhesions given multiple abd surgery. Hemodynamically stable, has resolved.  - Removed NG tube, tolerating advancing to full liquids, and had crackers this AM without issues.   SCr 1.22 > 1.05 NOT consistent with AKI or CKD.  - Continue to avoid nephrotoxins as able.   Normocytic anemia: Unclear baseline. Stable at 11.4g/dl. No bleeding. - Monitor at follow up.  Discharge Instructions Discharge Instructions    Discharge instructions   Complete by:  As directed    Advance diet as tolerated from full liquids to soft. It is recommended that you do NOT take any antidiarrheal  medication. - If your symptoms return, seek medical attention promptly.   Increase activity slowly   Complete by:  As directed      Allergies as of 01/20/2018      Reactions   Mushroom Extract Complex Anaphylaxis      Medication List    TAKE these medications   multivitamin with minerals Tabs tablet Take 1 tablet by mouth daily.   polyethylene glycol packet Commonly known as:  MIRALAX / GLYCOLAX Take 17 g by mouth 2 (two) times daily.   PROBIOTIC PO Take 1 tablet by mouth every 3 (three) days.      Follow-up Information    Primary Care Provider. Schedule an appointment as soon as possible for a visit in 2 week(s).          Allergies  Allergen Reactions  . Mushroom Extract Complex Anaphylaxis    Consultations:  General Surgery, Dr. Fredricka Bonineonnor  Procedures/Studies: Ct Abdomen Pelvis W Contrast  Result Date: 01/18/2018 CLINICAL DATA:  Abdominal pain and vomiting. History of bowel obstruction with colostomy and subsequent reversal. History of bowel resection. EXAM: CT ABDOMEN AND PELVIS WITH CONTRAST TECHNIQUE: Multidetector CT imaging of the abdomen and pelvis was performed using the standard protocol following bolus administration of intravenous contrast. CONTRAST:  100mL ISOVUE-300 IOPAMIDOL (ISOVUE-300) INJECTION 61% COMPARISON:  None. FINDINGS: LOWER CHEST: No basilar pulmonary nodules or pleural effusion. No apical pericardial effusion. HEPATOBILIARY: Scattered punctate hypodensities throughout the liver, too small to characterize accurately, but most likely small cysts. Status post cholecystectomy. PANCREAS: Normal parenchymal contours without ductal dilatation. No peripancreatic fluid collection. SPLEEN: Normal. ADRENALS/URINARY TRACT: --Adrenal glands: Normal. --Right kidney/ureter: Single nonobstructing renal calculus measuring 2  mm. No hydronephrosis, perinephric stranding or solid renal mass. --Left kidney/ureter: No hydronephrosis, nephroureterolithiasis, perinephric  stranding or solid renal mass. --Urinary bladder: Normal for degree of distention STOMACH/BOWEL: --Stomach/Duodenum: Postsurgical changes of the stomach. Normal caliber duodenum. --Small bowel: There is moderate dilatation of the mid segment small bowel. The jejunum and distal ileum are decompressed. No definite transition point is identified but caliber change appears most abrupt in the anterior right lower quadrant. --Colon: No focal abnormality. --Appendix: Not visualized. No right lower quadrant inflammation or free fluid. VASCULAR/LYMPHATIC: Normal aorta, proximal celiac axis and proximal superior mesenteric artery. The portal vein, splenic vein, superior mesenteric vein and IVC are patent. No abdominal or pelvic lymphadenopathy. REPRODUCTIVE: Status post prostatectomy. MUSCULOSKELETAL. No bony spinal canal stenosis or focal osseous abnormality. OTHER: None. IMPRESSION: Dilated midportion of the small bowel with fecalization of the small bowel contents, consistent with small-bowel obstruction. No definitive transition point identified, but caliber change appears to occur in the anterior right lower quadrant. The distal ileum is decompressed. Electronically Signed   By: Deatra Robinson M.D.   On: 01/18/2018 02:55   Dg Abd Portable 1v-small Bowel Obstruction Protocol-initial, 8 Hr Delay  Result Date: 01/18/2018 CLINICAL DATA:  8 hour delay film on small-bowel obstruction EXAM: PORTABLE ABDOMEN - 1 VIEW COMPARISON:  Film from earlier in the same day. FINDINGS: Nasogastric catheter is again identified. The previously administered contrast material now lies within the colon. No obstructive changes in the small bowel are identified. IMPRESSION: Contrast material within the colon at 8 hours. The degree of small bowel dilatation has improved from the prior exam. Electronically Signed   By: Alcide Clever M.D.   On: 01/18/2018 17:25   Dg Abd Portable 1v  Result Date: 01/18/2018 CLINICAL DATA:  Nasogastric tube  placement. EXAM: PORTABLE ABDOMEN - 1 VIEW COMPARISON:  Abdomen and pelvis CT obtained earlier today. FINDINGS: Nasogastric tube tip in the proximal stomach. The side hole is in the region of the gastroesophageal junction. Mildly prominent small bowel loops in the mid abdomen. Bilateral pelvic surgical clips. Bilateral upper abdominal surgical clips and staples. Mild scoliosis. Lumbar spine degenerative changes. IMPRESSION: 1. Nasogastric tube tip in the proximal stomach and side hole in the region of the gastroesophageal junction. 2. Mildly improved pattern of small bowel obstruction. Electronically Signed   By: Beckie Salts M.D.   On: 01/18/2018 08:48   Subjective: Feels 100% better. No abdominal pain, N/V. Having flatus, had diarrhea last night but none today. Cleared for DC by surgery.   Discharge Exam: Vitals:   01/19/18 2100 01/20/18 0603  BP: 135/87 117/73  Pulse: 69 63  Resp: 17 15  Temp: 98 F (36.7 C) 97.9 F (36.6 C)  SpO2: 99% 96%   General: Pt is alert, awake, not in acute distress Cardiovascular: RRR, S1/S2 +, no rubs, no gallops Respiratory: CTA bilaterally, no wheezing, no rhonchi Abdominal: Soft, NT, ND, bowel sounds + Extremities: No edema, no cyanosis  Labs: Basic Metabolic Panel: Recent Labs  Lab 01/18/18 0020 01/18/18 0500 01/19/18 0537  NA 138 137 139  K 3.8 4.3 3.4*  CL 106 109 110  CO2 22 18* 21*  GLUCOSE 128* 118* 107*  BUN 25* 24* 12  CREATININE 1.22 1.01 1.05  CALCIUM 9.2 8.0* 8.2*   Liver Function Tests: Recent Labs  Lab 01/18/18 0020  AST 27  ALT 18  ALKPHOS 84  BILITOT 0.7  PROT 7.2  ALBUMIN 4.0   Recent Labs  Lab 01/18/18 0020  LIPASE  28   CBC: Recent Labs  Lab 01/18/18 0020 01/18/18 0500 01/19/18 0537 01/20/18 0617  WBC 7.8 9.4 8.0 6.4  HGB 14.2 12.5* 11.5* 11.4*  HCT 42.6 37.9* 35.8* 35.3*  MCV 88.4 88.3 89.9 89.1  PLT 294 227 239 230   CBG: Recent Labs  Lab 01/18/18 0603 01/19/18 0657 01/20/18 0600  GLUCAP 130*  86 89   Urinalysis    Component Value Date/Time   COLORURINE YELLOW 01/18/2018 0013   APPEARANCEUR HAZY (A) 01/18/2018 0013   LABSPEC 1.021 01/18/2018 0013   PHURINE 5.0 01/18/2018 0013   GLUCOSEU NEGATIVE 01/18/2018 0013   HGBUR NEGATIVE 01/18/2018 0013   BILIRUBINUR NEGATIVE 01/18/2018 0013   KETONESUR NEGATIVE 01/18/2018 0013   PROTEINUR NEGATIVE 01/18/2018 0013   NITRITE NEGATIVE 01/18/2018 0013   LEUKOCYTESUR NEGATIVE 01/18/2018 0013    Time coordinating discharge: Approximately 40 minutes  Hazeline Junker, MD  [facility] 01/20/2018, 8:12 AM Pager [phone]

## 2018-01-20 NOTE — Plan of Care (Signed)
  Problem: Nutrition: Goal: Adequate nutrition will be maintained Outcome: Progressing   Problem: Elimination: Goal: Will not experience complications related to bowel motility Outcome: Progressing   Problem: Safety: Goal: Ability to remain free from injury will improve Outcome: Progressing   Problem: Skin Integrity: Goal: Risk for impaired skin integrity will decrease Outcome: Progressing   

## 2018-04-08 ENCOUNTER — Inpatient Hospital Stay
Admit: 2018-04-08 | Discharge: 2018-04-08 | Disposition: A | Discharge status: Home / Self Care | Payer: PRIVATE HEALTH INSURANCE | Attending: Emergency Medicine

## 2018-04-08 ENCOUNTER — Emergency Department: Admit: 2018-04-08 | Payer: PRIVATE HEALTH INSURANCE | Primary: Family Medicine

## 2018-04-08 DIAGNOSIS — S0990XA Unspecified injury of head, initial encounter: Secondary | ICD-10-CM

## 2018-04-08 NOTE — ED Notes (Signed)
Pt cleared for discharge per MD. Pt discharge instructions explained, No Rx Given. Pt Verbalized understanding of all instructions and all patient questions answered to their satisfaction. Pt departs from ED in stable condition.        Deanne CofferJennifer N Jahrel Borthwick, RN  04/08/18 [phone]

## 2018-04-08 NOTE — Discharge Instructions (Signed)
PLEASE RETURN TO THE EMERGENCY DEPARTMENT IMMEDIATELY if your symptoms worsen in anyway or in 8-12 hours if not improved for re-evaluation.  You should immediately return to the ER for symptoms such as new or worsening pain, fever, visual or hearing changes, stiff neck, rash, a feeling of passing out, chest pain, shortness of breath, persistent nausea and/or vomiting, numbness or weakness to the arms or legs, coolness or color change of the arms or legs.  You should avoid contact sports or activities where you might hit your head for a minimum of 1 week.      Take your medication as indicated and prescribed.  If you are given an antibiotic then, make sure you get the prescription filled and take the antibiotics until finished.      Please understand that at this time there is no evidence for a more serious underlying process, but that early in the process of an illness or injury, an emergency department workup can be falsely reassuring.  You should contact your family doctor within the next 24 hours for a follow up appointment    THANK YOU!!!    From [facility] and [facility]    On behalf of the Emergency Department staff at [facility], I would like to thank you for giving us the opportunity to address your health care needs and concerns.    We hope that during your visit, our service was delivered in a professional and caring manner. Please keep [facility] in mind as we walk with you down the path to your own personal wellness.     Please expect an automated text message or email from us so we can ask a few questions about your health and progress. Based on your answers, a clinician may call you back to offer help and instructions.    Please understand that early in the process of an illness or injury, an emergency department workup can be falsely reassuring.  If you notice any worsening, changing or persistent symptoms please call your family doctor or return to the ER immediately.     Tell  us how we did during your visit at [email or web address]   and let us know about your experience

## 2018-04-08 NOTE — ED Provider Notes (Signed)
[facility]  [phone]  [place] [address]  Phone: [phone]  Fax: [phone]        Pt Name: Jake Morales  MRN: [id number]  Birthdate September 29, 1956  Date of evaluation: 04/08/18      CHIEF COMPLAINT     Chief Complaint   Patient presents with   ??? Head Injury         HISTORY OF PRESENT ILLNESS  (Location/Symptom, Timing/Onset, Context/Setting, Quality, Duration, Modifying Factors, Severity.)    Jake Morales is a 62 y.o. male who presents with a head injury.  The patient states that yesterday he was working on a door that hit him in the head causing an abrasion to the top of his head patient states he has a mild headache which he had yesterday and today so he comes to the ER for evaluation he denies any neck pain no vision or hearing changes no numbness no weakness tetanus is up-to-date nothing makes his symptoms better or worse      REVIEW OF SYSTEMS    (2-9 systems for level 4, 10 or more for level 5)     Review of Systems   Eyes: Negative for photophobia and visual disturbance.   Gastrointestinal: Negative for nausea and vomiting.   Musculoskeletal: Negative for back pain and neck pain.   Neurological: Positive for headaches. Negative for weakness and numbness.       PAST MEDICAL HISTORY    has a past medical history of Bowel obstruction (HCC), Chronic back pain, Diverticulosis, Hypercholesteremia, Knee pain, Peptic ulcer, Personal history of prostate cancer, and Prostatitis, acute.    SURGICAL HISTORY      has a past surgical history that includes Appendectomy; Cholecystectomy; Colonoscopy; Prostate Biopsy; Prostatectomy; Bladder surgery; colostomy; Revision Colostomy; and colectomy.    CURRENTMEDICATIONS       Previous Medications    ASPIRIN 81 MG CHEWABLE TABLET    Take 81 mg by mouth daily    MULTIPLE VITAMINS-MINERALS (THERAPEUTIC MULTIVITAMIN-MINERALS) TABLET    Take 1 tablet by mouth daily.    POLYETHYLENE GLYCOL 3350 (MIRALAX PO)    Take by mouth    PROBIOTIC PRODUCT  (PROBIOTIC PO)    Take by mouth    SILDENAFIL (REVATIO) 20 MG TABLET    Take 1 tablet by mouth as needed (ED) Take 1-5 tablets 1/2 hour prior to sexual intercourse prn ED       ALLERGIES     is allergic to bee pollen and mushroom extract complex.    FAMILY HISTORY     has no family status information on file.      Family history is unknown by patient.    SOCIAL HISTORY      reports that he has never smoked. He has never used smokeless tobacco. He reports that he drinks alcohol. He reports that he does not use drugs.    PHYSICAL EXAM    (up to 7 for level 4, 8 or more for level 5)   INITIAL VITALS:  height is 5\' 10"  (1.778 m) and weight is 83.9 kg (185 lb). His oral temperature is 98.2 ??F (36.8 ??C). His blood pressure is 122/73 and his pulse is 73. His respiration is 14 and oxygen saturation is 94%.      Physical Exam   Constitutional: He is oriented to person, place, and time. He appears well-developed and well-nourished.   HENT:   Right Ear: External ear normal.   Left Ear: External  ear normal.   There is an abrasion of the top of the scalp otherwise without further rashes or lesions no hemotympanum   Eyes: Pupils are equal, round, and reactive to light. Conjunctivae and EOM are normal.   Neck: Normal range of motion. Neck supple. No JVD present. No tracheal deviation present.   No posterior neck pain to palpation flexion extension or rotation of the neck   Musculoskeletal: Normal range of motion. He exhibits no tenderness.   Lymphadenopathy:     He has no cervical adenopathy.   Neurological: He is alert and oriented to person, place, and time. No cranial nerve deficit or sensory deficit. Coordination normal.   GCS of 15 with no focal deficits appreciated   Skin:   Abrasion to the top of the scalp otherwise without further rashes or lesions   Psychiatric: He has a normal mood and affect.   Nursing note and vitals reviewed.      DIFFERENTIAL DIAGNOSIS/ MDM:     I did discuss the risks and benefits of a CT scan of  the head and the patient would like to proceed with a CT    DIAGNOSTIC RESULTS         RADIOLOGY:  Non-plain film images such as CT, Ultrasound and MRI are read by the radiologist. Plain radiographic images are visualized and the radiologist interpretations are reviewed as follows:         Interpretation per the Radiologist below, if available at the time of this note:    CT Head WO Contrast (Final result)   Result time 04/08/18 15:38:14   Final result by Joanne Chars, MD (04/08/18 15:38:14)                Impression:    No acute intracranial abnormality.            Narrative:    EXAMINATION:  CT OF THE HEAD WITHOUT CONTRAST ??04/08/2018 3:32 pm    TECHNIQUE:  CT of the head was performed without the administration of intravenous  contrast. Dose modulation, iterative reconstruction, and/or weight based  adjustment of the mA/kV was utilized to reduce the radiation dose to as low  as reasonably achievable.    COMPARISON:  Head CT dated 12/03/2016.    HISTORY:  ORDERING SYSTEM PROVIDED HISTORY: head injury  TECHNOLOGIST PROVIDED HISTORY:    Reason for Exam: superior head laceration  Acuity: Acute  Type of Exam: Initial  Mechanism of Injury: hit head on door yesterday    FINDINGS:  BRAIN/VENTRICLES: There is no acute intracranial hemorrhage, mass effect or  midline shift. ??No abnormal extra-axial fluid collection. ??The gray-white  differentiation is maintained without evidence of an acute infarct. ??There is  no evidence of hydrocephalus.    ORBITS: The visualized portion of the orbits demonstrate no acute abnormality.    SINUSES: The visualized paranasal sinuses and mastoid air cells demonstrate  no acute abnormality.    SOFT TISSUES/SKULL: ??No acute abnormality of the visualized skull or soft  tissues.                  LABS:  No results found for this visit on 04/08/18.        EMERGENCY DEPARTMENT COURSE:   Vitals:    Vitals:    04/08/18 1511   BP: 122/73   Pulse: 73   Resp: 14   Temp: 98.2 ??F (36.8 ??C)   TempSrc: Oral    SpO2: 94%   Weight: 83.9 kg (185 lb)  Height: 5\' 10"  (1.778 m)     -------------------------  BP: 122/73, Temp: 98.2 ??F (36.8 ??C), Pulse: 73, Resp: 14      RE-EVALUATION:  Ct shows no acute process the patient is neurovascularly intact given this I do feel able to be followed up as an outpatient    The patient presents with a closed head injury. The patient is neurologically intact. The presentation does not suggest a serious head injury.  Signs and symptoms of a serious head injury have been discussed with the patient and caregiver, who will be vigilant for these. Concerns of repeat head injury have also been discussed. The patient has been observed adequately in the ED. Pt has been instructed to return to the ED if symptoms do not go away or worsen or change in any way.     The patient understands that at this time there is no evidence for a more malignant underlying process, but the patient also understands that early in the process of an illness or injury, an emergency department workup can be falsely reassuring.  Routine discharge counseling was given, and the patient understands that worsening, changing or persistent symptoms should prompt an immediate call or follow up with their primary physician or return to the emergency department. The importance of appropriate follow up was also discussed.  I have reviewed the disposition diagnosis with the patient and or their family/guardian.  I have answered their questions and given discharge instructions.  They voiced understanding of these instructions and did not have any further questions or complaints.      PROCEDURES:  None    FINAL IMPRESSION      1. Injury of head, initial encounter          DISPOSITION/PLAN   DISPOSITION Decision To Discharge 04/08/2018 04:12:48 PM      CONDITION ON DISPOSITION:   Stable    PATIENT REFERRED TO:  Aundra DubinJames D Diethelm, MD  [address]  [address]  [place] [id number]  [phone]    Call in 2 days        DISCHARGE  MEDICATIONS:  New Prescriptions    No medications on file       (Please note that portions of this note were completed with a voicerecognition program.  Efforts were made to edit the dictations but occasionally words are mis-transcribed.)    Manfred Shirtshristopher Pleasant Bensinger,, MD, F.A.C.E.P.  Attending Emergency Medicine Physician       Manfred Shirtshristopher Chayim Bialas, MD  04/08/18 [phone]

## 2018-04-08 NOTE — ED Notes (Signed)
PT ambulated to room 5. C/o head injury approx 24 hrs ago. Pt reports he stood up into edge of a door yesterday and had small laceration to top of head. Pt reports no LOC at time of incident. Pt denies any blood thinnners at this time. Pt here because he would like to have a CT scan to ensure he does not have bleeding in his brain. Pt alert and oriented speaking sentences no distress noted. Pt PERRL smile symmetrical tongue midline.      Deanne CofferJennifer N Shayanna Thatch, RN  04/08/18 1531

## 2018-06-16 LAB — PSA, DIAGNOSTIC: PSA: 0.01 ng/mL

## 2018-06-17 ENCOUNTER — Encounter

## 2018-07-01 ENCOUNTER — Ambulatory Visit
Admit: 2018-07-01 | Discharge: 2018-07-01 | Payer: BLUE CROSS/BLUE SHIELD | Attending: Specialist | Primary: Family Medicine

## 2018-07-01 DIAGNOSIS — N5231 Erectile dysfunction following radical prostatectomy: Secondary | ICD-10-CM

## 2018-07-01 LAB — POCT URINALYSIS DIPSTICK W/O MICROSCOPE (AUTO)
Glucose, UA POC: NEGATIVE
Leukocytes, UA: NEGATIVE
Nitrite, UA: NEGATIVE
Protein, UA POC: NEGATIVE

## 2018-07-01 MED ORDER — SILDENAFIL CITRATE 100 MG PO TABS
100 | ORAL_TABLET | ORAL | 11 refills | Status: DC | PRN
Start: 2018-07-01 — End: 2020-01-19

## 2018-07-01 NOTE — Progress Notes (Signed)
Tama Headings Sande Brothers, MD FACS    Urology Office Progress Note    Patient:  Jake Morales  Date of Birth: 1956-04-07  Date: 07/01/2018    HISTORY OF PRESENT ILLNESS:   The patient is a 62 y.o. male who was last seen on 07/02/17.    Prostate Cancer  Patient is here today for prostate cancer which was first diagnosed several years ago.  His last several PSA values are as follows:  Lab Results   Component Value Date    PSA 0.01 06/16/2018    PSA 0.02 05/13/2017    PSA 0.01 10/08/2016     Previous treatment of prostate cancer: Radical retropubic prostatectomy 01/13/01  Lower urinary tract symptoms: nocturia, 1 times per night   Associated Symptoms: No dysuria, gross hematuria.  Pain Severity: Pain Score:   0 - No pain/10    Summary of old records:   Prostate Cancer: 01/13/01 RRP T1c T2b G6  Bladder Neck contracture: TUIBNC 06/13/09  Male SUI: mild, 1 minipad per day  Microhematuria  ED: Rx for generic sildenafil 100 mg (1/2-1 tab) prn ED     Additional History: No evidence of prostate cancer recurrence s/p 01/13/01 radical prostatectomy.  Stable microhematuria with negative workup in the past.   Patient given an Rx for generic sildenafil 100 mg (1/2-1 tab) prn ED.    Procedures Today: N/A    Urinalysis today:  Results for POC orders placed in visit on 07/01/18   POCT Urinalysis No Micro (Auto)   Result Value Ref Range    Color, UA yellow     Clarity, UA clear     Glucose, UA POC negative     Bilirubin, UA      Ketones, UA      Spec Grav, UA      Blood, UA POC trace-intact     pH, UA      Protein, UA POC negative     Urobilinogen, UA      Leukocytes, UA negative     Nitrite, UA negative      Last several PSA's:  Lab Results   Component Value Date    PSA 0.01 06/16/2018    PSA 0.02 05/13/2017    PSA 0.01 10/08/2016     Last total testosterone:  No results found for: TESTOSTERONE    AUA Symptom Score (07/01/2018):  INCOMPLETE EMPTYING: How often have you had the sensation of not emptying your bladder?: Less than 1 to 5  times  FREQUENCY: How often do you have to urinate less than every two hours?: Less than Half the time  INTERMITTENCY: How often have you found you stopped and started again several times when you urinated?: Not at all  URGENCY: How often have you found it difficult to postpone urination?: Less than 1 to 5 times  WEAK STREAM: How often have you had a weak urinary stream?: Less than 1 to 5 times  STRAINING: How often have you had to strain to start  urination?: Not at all  NOCTURIA: How many times did you typically get up at night to uriniate?: 1 Time  TOTAL I-PSS SCORE:: 6  How would you feel if you were to spend the rest of your life with your urinary condition?: Mixe    Last AUA Symptom Score (QOL): 5 (3)    Last BUN and creatinine:  Lab Results   Component Value Date    BUN 17 12/03/2016     Lab Results   Component  Value Date    CREATININE 0.95 12/03/2016       Additional Lab/Culture results: none    Imaging Reviewed during this Office Visit: none  (results were independently reviewed by physician and radiology report verified)    PAST MEDICAL, FAMILY AND SOCIAL HISTORY UPDATE:  Past Medical History:   Diagnosis Date   ??? Bowel obstruction (HCC) 12/2017   ??? Chronic back pain    ??? Diverticulosis    ??? Hypercholesteremia    ??? Knee pain    ??? Peptic ulcer    ??? Personal history of prostate cancer    ??? Prostatitis, acute      Past Surgical History:   Procedure Laterality Date   ??? APPENDECTOMY     ??? BLADDER SURGERY      tuibnc 06-13-2009   ??? CHOLECYSTECTOMY     ??? COLECTOMY     ??? COLONOSCOPY     ??? COLOSTOMY     ??? PROSTATE BIOPSY      11-28-2000   ??? PROSTATECTOMY      01-13-2001   ??? REVISION COLOSTOMY       Family History   Family history unknown: Yes     Current Outpatient Medications   Medication Sig Dispense Refill   ??? sildenafil (VIAGRA) 100 MG tablet Take 1 tablet by mouth as needed for Erectile Dysfunction 10 tablet 11   ??? sildenafil (REVATIO) 20 MG tablet Take 1 tablet by mouth as needed (ED) Take 1-5 tablets 1/2 hour  prior to sexual intercourse prn ED 30 tablet 11   ??? aspirin 81 MG chewable tablet Take 81 mg by mouth daily     ??? Polyethylene Glycol 3350 (MIRALAX PO) Take by mouth     ??? Probiotic Product (PROBIOTIC PO) Take by mouth     ??? Multiple Vitamins-Minerals (THERAPEUTIC MULTIVITAMIN-MINERALS) tablet Take 1 tablet by mouth daily.       No current facility-administered medications for this visit.        Bee pollen and Mushroom extract complex  Social History     Tobacco Use   Smoking Status Never Smoker   Smokeless Tobacco Never Used     (If patient a smoker, smoking cessation counseling offered)    Social History     Substance and Sexual Activity   Alcohol Use Yes   ??? Alcohol/week: 0.0 standard drinks    Comment: SOCIAL       REVIEW OF SYSTEMS (obtained by ancillary medical staff):  Constitutional: negative  Eyes: negative  Neurological: negative  Endocrine: positive for heat intolerance  Gastrointestinal: negative  Cardiovascular: negative  Skin: negative   Musculoskeletal: positive for  arthralgias  Ears/Nose/Throat: positive for sinus pain  Respiratory: negative  Hematological/Lymphatic: negative  Psychological: negative    Physical Exam:    There were no vitals filed for this visit.  There is no height or weight on file to calculate BMI.  Patient is a 62 y.o. male in no acute distress and alert and oriented to person, place and time.      Assessment and Plan      1. Erectile dysfunction after radical prostatectomy    2. Microscopic hematuria    3. Stress incontinence, male    4. Personal history of malignant neoplasm of prostate           Plan:      Return in about 1 year (around 07/02/2019) for PSA.  No evidence of prostate cancer recurrence s/p 01/13/01 radical prostatectomy.  Stable microhematuria with negative workup in the past.     Patient given an Rx for generic sildenafil 100 mg (1/2-1 tab) prn ED.         Tama Headings Stevi Hollinshead, MD FACS  07/01/2018 3:49 PM    2019 PQRS Documentation: N/A

## 2019-03-29 IMAGING — CT CT ABD-PELV W/ CM
2 of 5 series · 16 of 46 positions shown, 18 images · IV contrast (Omni 300)
Comparison: None.

CLINICAL DATA: Abdominal pain and vomiting. History of bowel
obstruction with colostomy and subsequent reversal. History of bowel
resection.

EXAM:
CT ABDOMEN AND PELVIS WITH CONTRAST
TECHNIQUE: Multidetector CT imaging of the abdomen and pelvis was performed
using the standard protocol following bolus administration of
intravenous contrast.
CONTRAST:  100mL MQWJXD-Q22 IOPAMIDOL (MQWJXD-Q22) INJECTION 61%

[Series 3: a/p w/ 5mm · axial · 0.97mm/px · z∈[+686,+1182]mm · 13 of 113 slices shown, 15 images]
[im 7/113  soft-tissue]
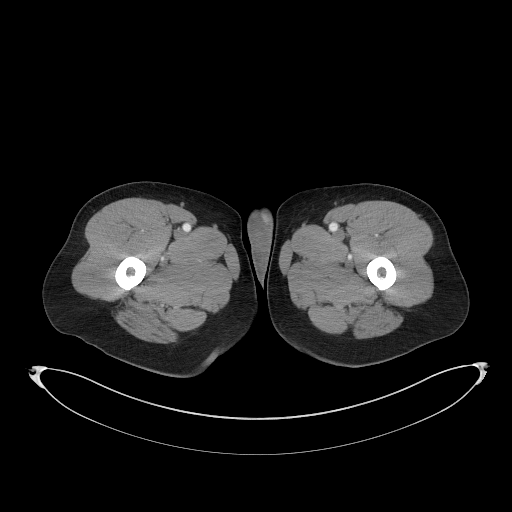
[im 7/113  bone]
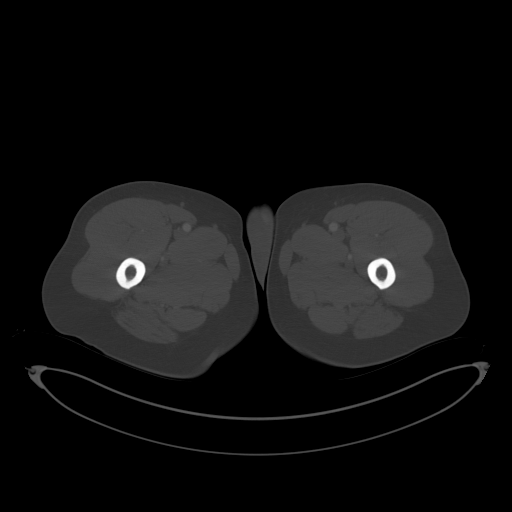
[im 13/113  soft-tissue]
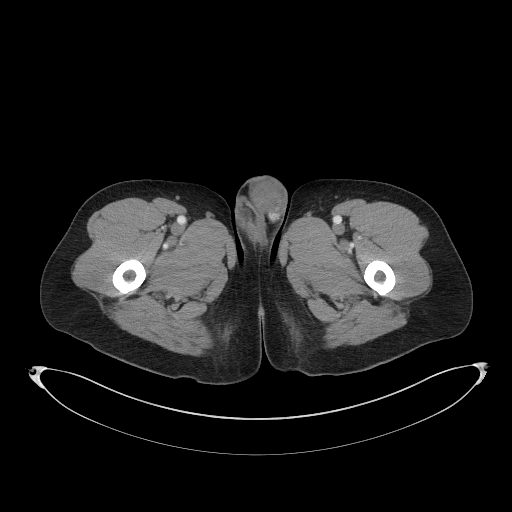
[im 25/113  soft-tissue]
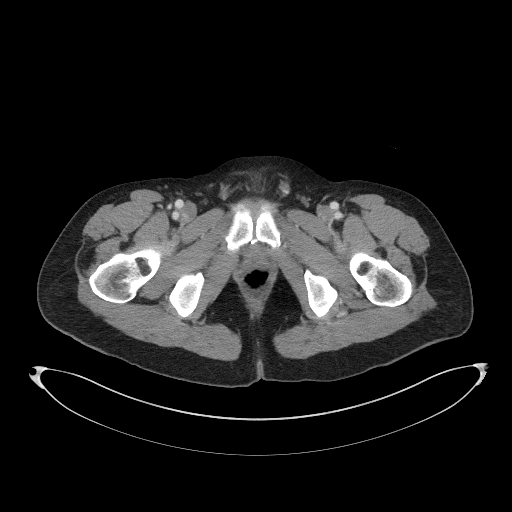
[im 32/113  soft-tissue]
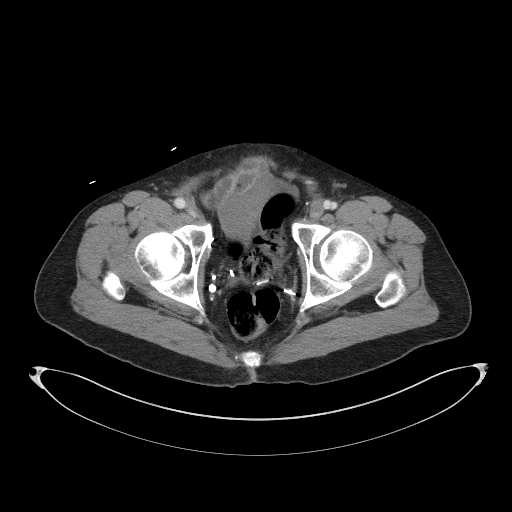
[im 38/113  soft-tissue]
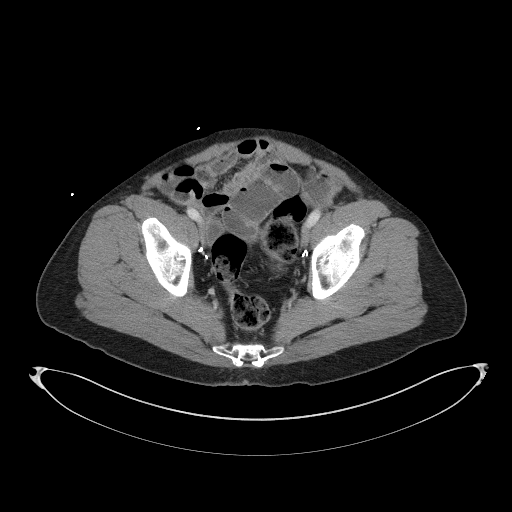
[im 50/113  soft-tissue]
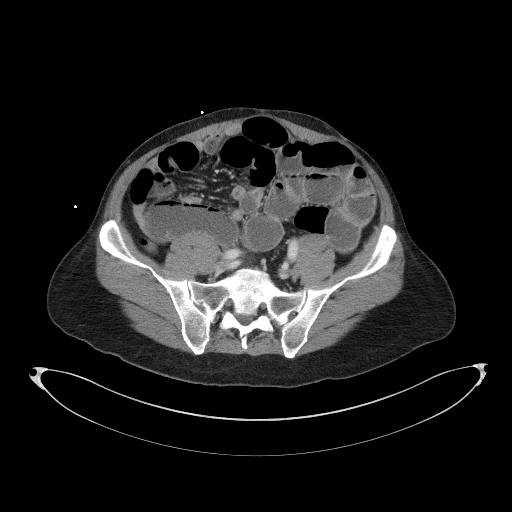
[im 57/113  soft-tissue]
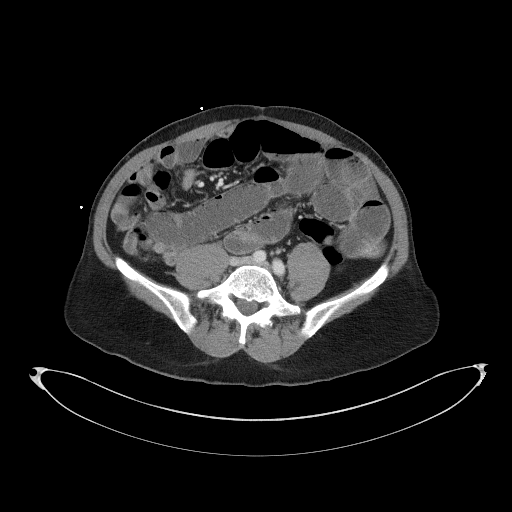
[im 63/113  soft-tissue]
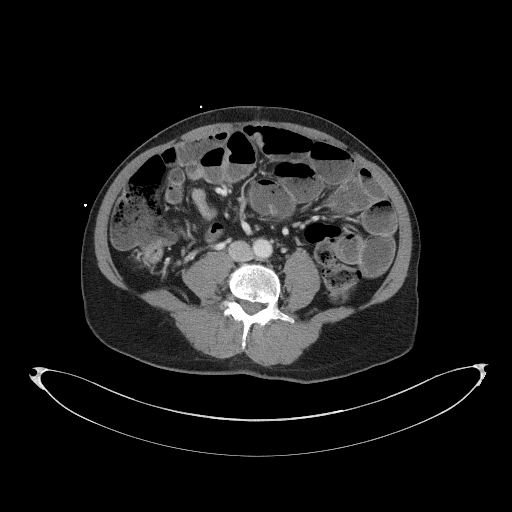
[im 75/113  soft-tissue]
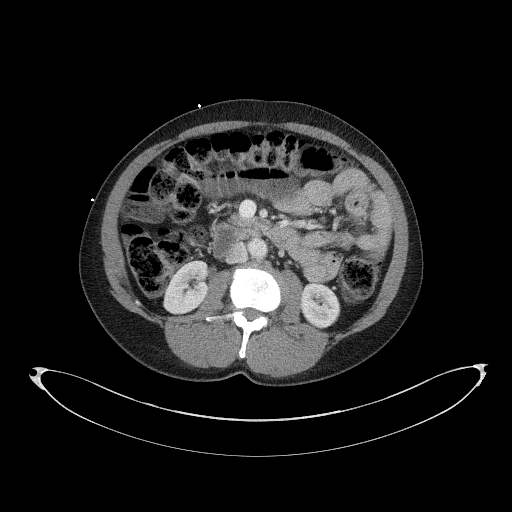
[im 75/113  bone]
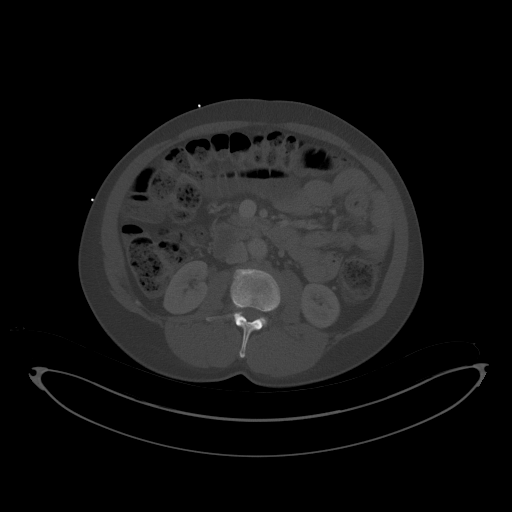
[im 81/113  soft-tissue]
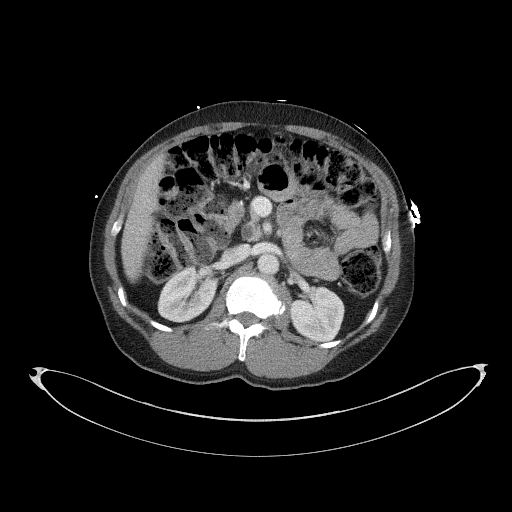
[im 88/113  soft-tissue]
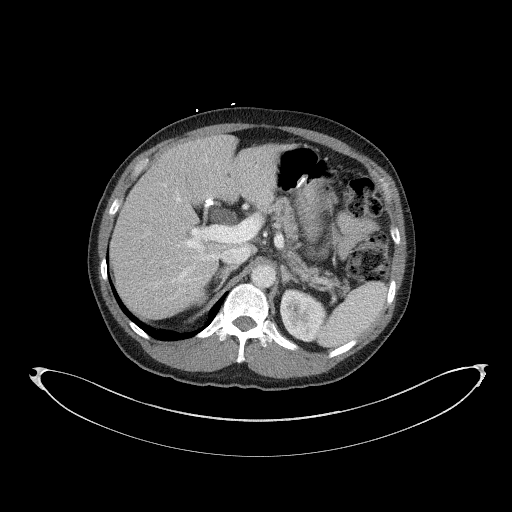
[im 100/113  soft-tissue]
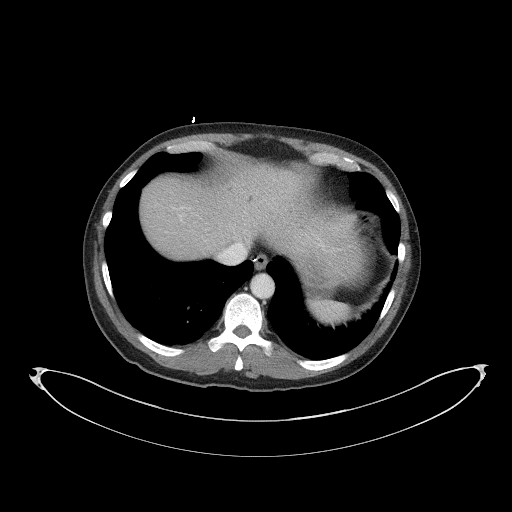
[im 106/113  soft-tissue]
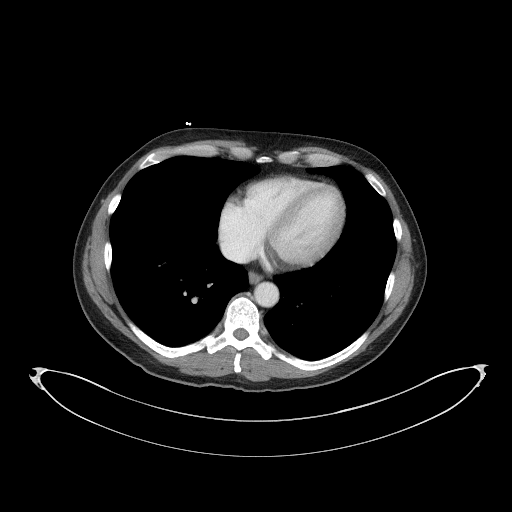

[Series 6: a/p w/ cor · coronal · 0.90mm/px · 3 of 160 slices shown]
[im 54/160  soft-tissue]
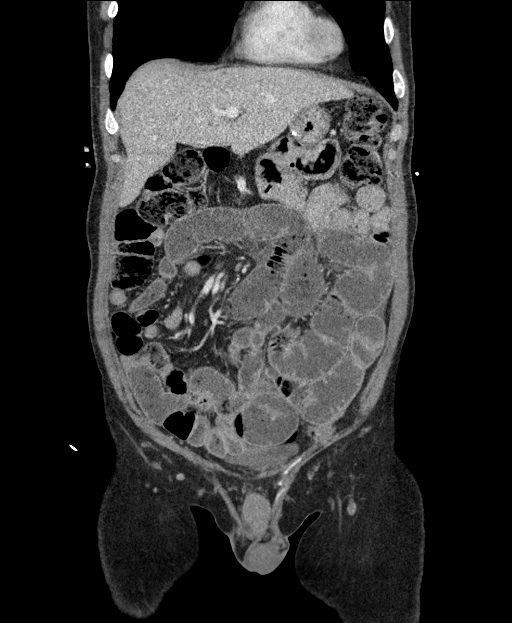
[im 71/160  soft-tissue]
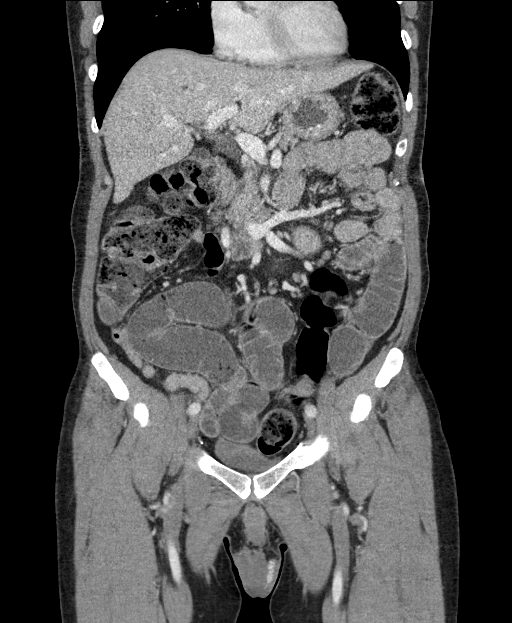
[im 89/160  soft-tissue]
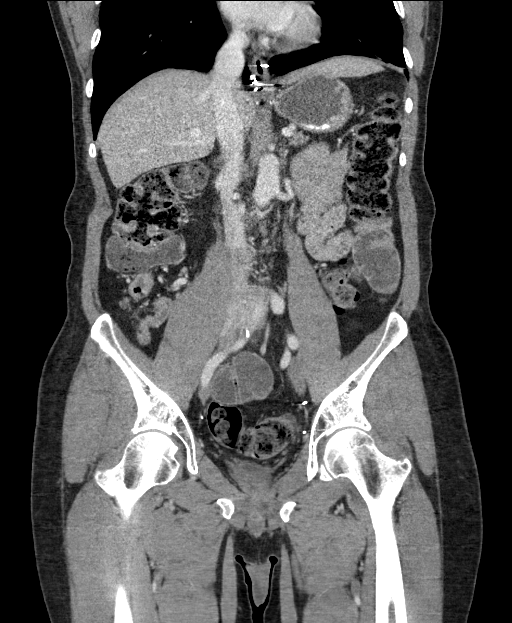

[16 of 46 positions shown; findings below may reference images not displayed]

FINDINGS: LOWER CHEST: No basilar pulmonary nodules or pleural effusion. No
apical pericardial effusion.

HEPATOBILIARY: Scattered punctate hypodensities throughout the
liver, too small to characterize accurately, but most likely small
cysts. Status post cholecystectomy.

PANCREAS: Normal parenchymal contours without ductal dilatation. No
peripancreatic fluid collection.

SPLEEN: Normal.

ADRENALS/URINARY TRACT:

--Adrenal glands: Normal.

--Right kidney/ureter: Single nonobstructing renal calculus
measuring 2 mm. No hydronephrosis, perinephric stranding or solid
renal mass.

--Left kidney/ureter: No hydronephrosis, nephroureterolithiasis,
perinephric stranding or solid renal mass.

--Urinary bladder: Normal for degree of distention

STOMACH/BOWEL:

--Stomach/Duodenum: Postsurgical changes of the stomach. Normal
caliber duodenum.

--Small bowel: There is moderate dilatation of the mid segment small
bowel. The jejunum and distal ileum are decompressed. No definite
transition point is identified but caliber change appears most
abrupt in the anterior right lower quadrant.

--Colon: No focal abnormality.

--Appendix: Not visualized. No right lower quadrant inflammation or
free fluid.

VASCULAR/LYMPHATIC: Normal aorta, proximal celiac axis and proximal
superior mesenteric artery. The portal vein, splenic vein, superior
mesenteric vein and IVC are patent. No abdominal or pelvic
lymphadenopathy.

REPRODUCTIVE: Status post prostatectomy.

MUSCULOSKELETAL. No bony spinal canal stenosis or focal osseous
abnormality.

OTHER: None.
IMPRESSION: Dilated midportion of the small bowel with fecalization of the small
bowel contents, consistent with small-bowel obstruction. No
definitive transition point identified, but caliber change appears
to occur in the anterior right lower quadrant. The distal ileum is
decompressed.

## 2019-03-29 IMAGING — DX DG ABD PORTABLE 1V
1 series · 1 of 1 positions shown · non-contrast
Comparison: Abdomen and pelvis CT obtained earlier today.

CLINICAL DATA: Nasogastric tube placement.

EXAM:
PORTABLE ABDOMEN - 1 VIEW

[abdomen]
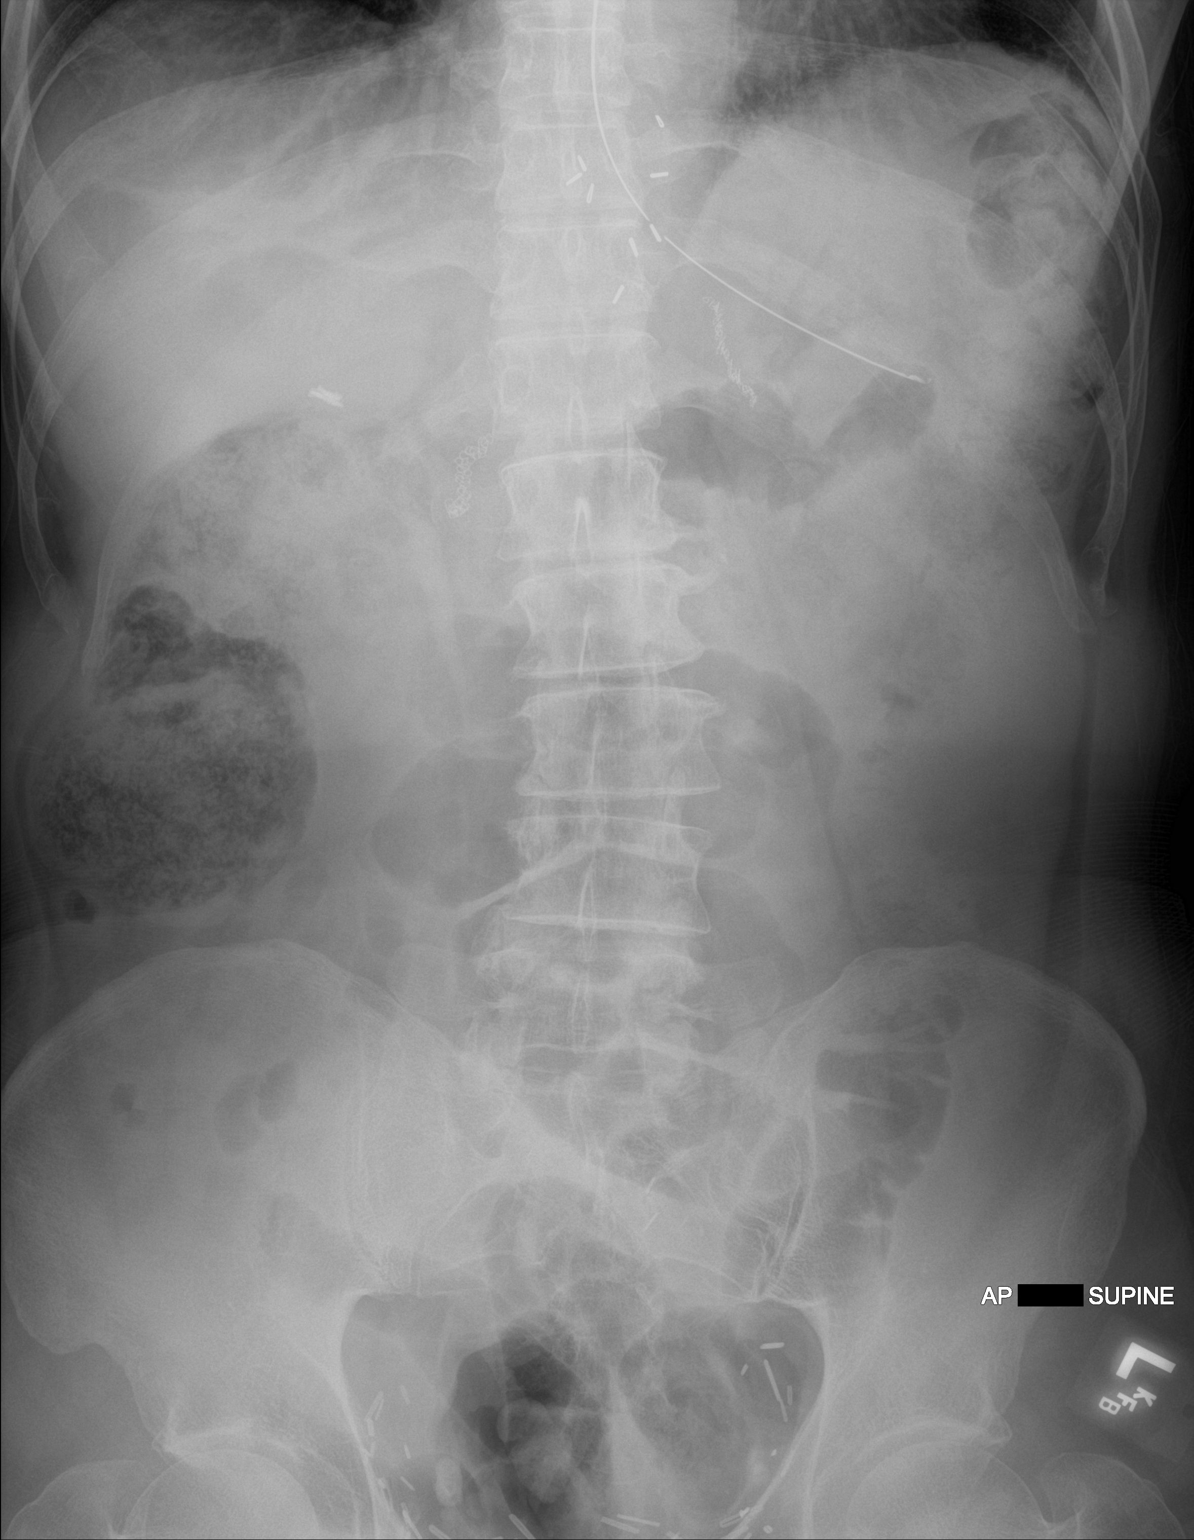

[1 of 1 positions shown; findings below may reference images not displayed]

FINDINGS: Nasogastric tube tip in the proximal stomach. The side hole is in
the region of the gastroesophageal junction. Mildly prominent small
bowel loops in the mid abdomen. Bilateral pelvic surgical clips.
Bilateral upper abdominal surgical clips and staples. Mild
scoliosis. Lumbar spine degenerative changes.
IMPRESSION: 1. Nasogastric tube tip in the proximal stomach and side hole in the
region of the gastroesophageal junction.
2. Mildly improved pattern of small bowel obstruction.

## 2019-06-30 LAB — PSA, DIAGNOSTIC: PSA: 0.01 ng/mL

## 2019-07-01 ENCOUNTER — Encounter

## 2019-07-14 ENCOUNTER — Ambulatory Visit: Admit: 2019-07-14 | Discharge: 2019-07-14 | Payer: MEDICARE | Attending: Specialist | Primary: Family Medicine

## 2019-07-14 DIAGNOSIS — N5231 Erectile dysfunction following radical prostatectomy: Secondary | ICD-10-CM

## 2019-07-14 LAB — POCT URINALYSIS DIPSTICK W/O MICROSCOPE (AUTO)
Glucose, UA POC: NEGATIVE
Leukocytes, UA: NEGATIVE
Nitrite, UA: NEGATIVE
Protein, UA POC: NEGATIVE

## 2019-07-14 MED ORDER — TADALAFIL 20 MG PO TABS
20 MG | ORAL_TABLET | Freq: Every day | ORAL | 11 refills | Status: DC | PRN
Start: 2019-07-14 — End: 2021-10-05

## 2019-07-14 NOTE — Progress Notes (Signed)
Tama HeadingsGregory D. Sande BrothersHaselhuhn, MD FACS    Urology Office Progress Note    Patient:  Jake GeroldGary J Morales  Date of Birth: [birth year]  Date: 07/14/2019    HISTORY OF PRESENT ILLNESS:   The patient is a 63 y.o. male who was last seen on 07/01/18.    Prostate Cancer  Patient is here today for prostate cancer which was first diagnosed several years ago.  His last several PSA values are as follows:  Lab Results   Component Value Date    PSA 0.01 06/30/2019    PSA 0.01 06/16/2018    PSA 0.02 05/13/2017     Previous treatment of prostate cancer: Radical retropubic prostatectomy   Lower urinary tract symptoms: nocturia, 1 times per night   Associated Symptoms: No dysuria, gross hematuria.  Pain Severity: Pain Score:   0 - No pain/10    Summary of old records:   Prostate Cancer: 01/13/01 RRP T1c T2b G6  Bladder Neck contracture: TUIBNC 06/13/09  Male SUI: mild, 1 minipad per day  Microhematuria  ED: generic sildenafil 100 mg (dizzy), Rx for Tadalafil (Cialis) 20 mg prn ED 07/14/19    Additional History: No evidence of prostate cancer recurrence s/p 01/13/01 radical prostatectomy.  Stable microhematuria with negative workup in the past.  Patient given an Rx for Tadalafil (Cialis) 20 mg prn ED.     Procedures Today: N/A    Urinalysis today:  Results for POC orders placed in visit on 07/14/19   POCT Urinalysis No Micro (Auto)   Result Value Ref Range    Color, UA yellow     Clarity, UA clear     Glucose, UA POC neg     Bilirubin, UA      Ketones, UA      Spec Grav, UA      Blood, UA POC trace     pH, UA      Protein, UA POC neg     Urobilinogen, UA      Leukocytes, UA neg     Nitrite, UA neg      Last several PSA's:  Lab Results   Component Value Date    PSA 0.01 06/30/2019    PSA 0.01 06/16/2018    PSA 0.02 05/13/2017     Last total testosterone:  No results found for: TESTOSTERONE    AUA Symptom Score (07/14/2019):  INCOMPLETE EMPTYING: How often have you had the sensation of not emptying your bladder?: Not at all  FREQUENCY: How often do you  have to urinate less than every two hours?: Less than 1 to 5 times  INTERMITTENCY: How often have you found you stopped and started again several times when you urinated?: Not at all  URGENCY: How often have you found it difficult to postpone urination?: Less than 1 to 5 times  WEAK STREAM: How often have you had a weak urinary stream?: Less than 1 to 5 times  STRAINING: How often have you had to strain to start  urination?: Not at all  NOCTURIA: How many times did you typically get up at night to uriniate?: 1 Time  TOTAL I-PSS SCORE:: 4  How would you feel if you were to spend the rest of your life with your urinary condition?: Mostly Satisfied    Last AUA Symptom Score (QOL): 6 (3)    Last BUN and creatinine:  Lab Results   Component Value Date    BUN 17 12/03/2016     Lab Results   Component  Value Date    CREATININE 0.95 12/03/2016       Additional Lab/Culture results: none    Imaging Reviewed during this Office Visit: none  (results were independently reviewed by physician and radiology report verified)    PAST MEDICAL, FAMILY AND SOCIAL HISTORY UPDATE:  Past Medical History:   Diagnosis Date   ??? Bowel obstruction ([place]) 12/2017   ??? Chronic back pain    ??? Diverticulosis    ??? Hypercholesteremia    ??? Knee pain    ??? Peptic ulcer    ??? Personal history of prostate cancer    ??? Prostatitis, acute      Past Surgical History:   Procedure Laterality Date   ??? APPENDECTOMY     ??? BLADDER SURGERY      tuibnc 06-13-2009   ??? CHOLECYSTECTOMY     ??? COLECTOMY     ??? COLONOSCOPY     ??? COLOSTOMY     ??? PROSTATE BIOPSY      11-28-2000   ??? PROSTATECTOMY      01-13-2001   ??? REVISION COLOSTOMY       Family History   Family history unknown: Yes     Current Outpatient Medications   Medication Sig Dispense Refill   ??? tadalafil (CIALIS) 20 MG tablet Take 1 tablet by mouth daily as needed for Erectile Dysfunction 10 tablet 11   ??? sildenafil (VIAGRA) 100 MG tablet Take 1 tablet by mouth as needed for Erectile Dysfunction 10 tablet 11   ??? sildenafil  (REVATIO) 20 MG tablet Take 1 tablet by mouth as needed (ED) Take 1-5 tablets 1/2 hour prior to sexual intercourse prn ED 30 tablet 11   ??? aspirin 81 MG chewable tablet Take 81 mg by mouth daily     ??? Polyethylene Glycol 3350 (MIRALAX PO) Take by mouth     ??? Probiotic Product (PROBIOTIC PO) Take by mouth     ??? Multiple Vitamins-Minerals (THERAPEUTIC MULTIVITAMIN-MINERALS) tablet Take 1 tablet by mouth daily.       No current facility-administered medications for this visit.        Bee pollen and Mushroom extract complex  Social History     Tobacco Use   Smoking Status Never Smoker   Smokeless Tobacco Never Used     (If patient a smoker, smoking cessation counseling offered)    Social History     Substance and Sexual Activity   Alcohol Use Yes   ??? Alcohol/week: 0.0 standard drinks    Comment: SOCIAL       REVIEW OF SYSTEMS (obtained by ancillary medical staff):  Level 2-3  Constitutional: negative  Level 4  Respiratory: negative  Level 5  Eyes: negative  Neurological: negative  Gastrointestinal: Positive for diarrhea  Cardiovascular: negative  Skin: positive for rash   Musculoskeletal: positive for  pain  Ears/Nose/Throat: negative  Psychological: negative    Physical Exam:      Vitals:    07/14/19 1503   BP: 139/84   Pulse: 66     Body mass index is 25.25 kg/m??.  Patient is a 63 y.o. male in no acute distress and alert and oriented to person, place and time.      Assessment and Plan      1. Erectile dysfunction after radical prostatectomy    2. Microscopic hematuria    3. Stress incontinence, male    4. Personal history of malignant neoplasm of prostate  Plan:      Return in about 1 year (around 07/13/2020) for PSA.  No evidence of prostate cancer recurrence s/p 01/13/01 radical prostatectomy.    Stable microhematuria with negative workup in the past.    Patient given an Rx for Tadalafil (Cialis) 20 mg prn ED.        Tama Headings Shawna Kiener, MD FACS  07/14/2019 3:31 PM    2020 Quality Measure Documentation:  Patient does not have an advance plan and does not want to name a durable power of attorney at this time.

## 2020-01-19 ENCOUNTER — Ambulatory Visit: Admit: 2020-01-19 | Discharge: 2020-01-19 | Payer: MEDICARE | Attending: Specialist | Primary: Family Medicine

## 2020-01-19 DIAGNOSIS — R1032 Left lower quadrant pain: Secondary | ICD-10-CM

## 2020-01-19 LAB — POCT URINALYSIS DIPSTICK W/O MICROSCOPE (AUTO)
Glucose, UA POC: NEGATIVE
Leukocytes, UA: NEGATIVE
Nitrite, UA: NEGATIVE
Protein, UA POC: NEGATIVE

## 2020-04-05 LAB — BUN & CREATININE
BUN: 21 mg/dL
Creatinine: 1.13

## 2020-06-20 LAB — PSA, TOTAL AND FREE
PSA, Free Pct: 15 %
PSA, Free: 0.008 ng/mL
PSA: 0.05

## 2020-07-19 ENCOUNTER — Ambulatory Visit: Admit: 2020-07-19 | Discharge: 2020-07-19 | Payer: MEDICARE | Attending: Specialist | Primary: Family Medicine

## 2020-07-19 DIAGNOSIS — R3129 Other microscopic hematuria: Secondary | ICD-10-CM

## 2020-07-19 LAB — POCT URINALYSIS DIPSTICK W/O MICROSCOPE (AUTO)
Glucose, UA POC: NEGATIVE
Leukocytes, UA: NEGATIVE
Nitrite, UA: NEGATIVE
Protein, UA POC: NEGATIVE

## 2021-03-29 MED ORDER — SULFAMETHOXAZOLE-TRIMETHOPRIM 800-160 MG PO TABS
800-160 MG | ORAL_TABLET | Freq: Two times a day (BID) | ORAL | 0 refills | Status: AC
Start: 2021-03-29 — End: 2021-04-08

## 2021-03-29 NOTE — Telephone Encounter (Signed)
LMM TO NOTIFY PT-KSE

## 2021-03-29 NOTE — Telephone Encounter (Signed)
PT. CALLED TO SAY THAT LATELY HE HAS BEEN HAVING BURNING WITH URINATION AND THERE IS AN ODOR TO HIS URINE- ORDER FOR C&S FAXED TO [facility]- HE WOULD LIKE TO HAVE AN RX FOR AN ATB PENDING RESULTS-PLEASE ADVISE-KSE

## 2021-03-29 NOTE — Telephone Encounter (Signed)
Get urine for C&S first then see eRx for Bactrim.

## 2021-04-03 ENCOUNTER — Encounter

## 2021-04-23 ENCOUNTER — Encounter

## 2021-04-23 MED ORDER — SULFAMETHOXAZOLE-TRIMETHOPRIM 800-160 MG PO TABS
800-160 MG | ORAL_TABLET | Freq: Two times a day (BID) | ORAL | 0 refills | Status: DC
Start: 2021-04-23 — End: 2021-06-20

## 2021-04-24 ENCOUNTER — Encounter

## 2021-05-02 NOTE — Telephone Encounter (Signed)
LMM TO CALL OFFICE AND SCHEDULE AN OFFICE VISIT FOR UA AND PVR-KSE

## 2021-05-02 NOTE — Telephone Encounter (Signed)
PT. CALLED TO SAY THAT HE HAS BEEN TAKING THE BACTRIM FOR HIS UTI BUT HAS BEEN HAVING LOTS OF TROUBLE WITH INCONTINENCE- HE HAD SOME CORTISONE SHOTS IN HIS BACK A FEW WEEKS AGO AND WAS TOLD TO CONTACT A DR. IF HE LOST CONTROL OF HIS BLADDER OR BOWELS- I ADVISED HIM TO CALL THAT DR ALSO-PLEASE ADVISE-KSE

## 2021-05-03 NOTE — Telephone Encounter (Signed)
APPT SCHEDULED FOR 05-09-21-KSE

## 2021-05-09 ENCOUNTER — Ambulatory Visit: Admit: 2021-05-09 | Discharge: 2021-05-09 | Payer: MEDICARE | Attending: Specialist | Primary: Family Medicine

## 2021-05-09 DIAGNOSIS — N390 Urinary tract infection, site not specified: Secondary | ICD-10-CM

## 2021-05-09 DIAGNOSIS — N3000 Acute cystitis without hematuria: Secondary | ICD-10-CM

## 2021-05-09 LAB — POCT URINALYSIS DIPSTICK W/O MICROSCOPE (AUTO)
Glucose, UA POC: NEGATIVE
Leukocytes, UA: NEGATIVE
Nitrite, UA: NEGATIVE
Protein, UA POC: NEGATIVE

## 2021-05-09 NOTE — Progress Notes (Signed)
Tama Headings Sande Brothers, MD FACS    Urology Office Progress Note    Patient:  Jake Morales  Date of Birth: 10-24-1955  Date: 05/09/2021    HISTORY OF PRESENT ILLNESS:   The patient is a 65 y.o. male  The patient presents with a persistent Kleb pneumo UTI despite appropriate antibiotics.  He completed his second course yesterday.  Urine sent for C&S to rule out persistent infection.   Of note, patient recent founds to have an obstructing 4 mm Right ureteral calculus on 05/05/21 and this may explain why his UTI was not eradicated with the initial course of antibiotics.    He passed the stone and the stone will be sent for analysis to determine chemical composition.   He will need a Litholink 24 hour urine collection to determine etiology of stone disease.    Last AUA Symptom Score (QOL): 4 (1)  Today's AUA Symptom Score (QOL): 5 (2)    Summary of old records:   Prostate Cancer: 01/13/01 RRP T1c T2b G6  Bladder Neck contracture: TUIBNC 06/13/09  Male SUI: mild, 1 minipad per day  Microhematuria  ED: generic sildenafil 100 mg (dizzy), Rx for Tadalafil (Cialis) 20 mg prn ED 07/14/19  Left groin pain 01/19/20  03/30/21, 04/20/21 Kleb pneumo UTI: 05/09/21 PVR = 2 mL  4 mm R ureteral calculus on 05/05/21 CT; passed    Additional History: Postvoid Residual:  Post-void Residual by bladder scanner: 2 mL (05/10/21)     Procedures Today: N/A    Urinalysis today:  Results for POC orders placed in visit on 05/09/21   POCT Urinalysis No Micro (Auto)   Result Value Ref Range    Color, UA yellow     Clarity, UA clear     Glucose, UA POC neg     Bilirubin, UA      Ketones, UA      Spec Grav, UA      Blood, UA POC trace     pH, UA      Protein, UA POC neg     Urobilinogen, UA      Leukocytes, UA neg     Nitrite, UA neg        Last several PSA's:  Lab Results   Component Value Date    PSA 0.05 06/20/2020    PSA 0.01 06/30/2019    PSA 0.01 06/16/2018       Last total testosterone:  No results found for: TESTOSTERONE    Last BUN and creatinine:  Lab  Results   Component Value Date    BUN 17 12/03/2016     Lab Results   Component Value Date    CREATININE 0.95 12/03/2016       Last CBC:  Lab Results   Component Value Date    WBC 5.4 12/03/2016    HGB 12.2 (L) 12/03/2016    HCT 37.2 (L) 12/03/2016    MCV 85.1 12/03/2016    PLT 223 12/03/2016       Additional Lab/Culture results:   Specimen:  Urine - Urine specimen collection, clean catch (procedure)  Component 2 wk ago 04/20/21   Culture  >100,000 ORGANISMS/mL KLEBSIELLA PNEUMONIAE Abnormal     Resulting Agency [facility]     Specimen:  Urine - Urine specimen collection, clean catch (procedure)  Component 1 mo ago 03/30/21   Culture  >100,000 ORGANISMS/mL KLEBSIELLA PNEUMONIAE Abnormal     Resulting Agency [facility]  05/05/21 CT-scan of abdomen and pelvis   4 mm obstructing UVJ calculus    Imaging Reviewed during this Office Visit: none  (results were independently reviewed by physician and radiology report verified)    Physical Exam:    There were no vitals filed for this visit.  Body mass index is 25.25 kg/m??.  Patient is a 65 y.o. male in no acute distress and alert and oriented to person, place and time.    Assessment and Plan      1. Recurrent UTI    2. Right distal ureteral calculus    3. Erectile dysfunction after radical prostatectomy    4. Nocturia    5. Personal history of malignant neoplasm of prostate           Plan:      Return in about 6 weeks (around 06/20/2021) for PSA.  The patient presents with a persistent Kleb pneumo UTI despite appropriate antibiotics.  He completed his second course yesterday.  Urine sent for C&S to rule out persistent infection.   Of note, patient recent founds to have an obstructing 4 mm Right ureteral calculus on 05/05/21 and this may explain why his UTI was not eradicated with the initial course of antibiotics.    He passed the stone and the stone will be sent for analysis to determine chemical composition.   He will need a Litholink 24 hour  urine collection to determine etiology of stone disease.   Repeat PSA before his next OV.        Tama Headings Syreeta Figler, MD FACS  05/10/2021 7:30 AM    2022 Quality Measure Documentation:   Patient has an advance care plan and/or living will and the durable power of attorney (POA) is wife babra.    Social History     Tobacco Use   Smoking Status Never   Smokeless Tobacco Never     (If patient a smoker, smoking cessation counseling offered)

## 2021-05-10 ENCOUNTER — Inpatient Hospital Stay: Payer: MEDICARE | Primary: Family Medicine

## 2021-05-11 LAB — CULTURE, URINE: Culture: NO GROWTH

## 2021-05-14 LAB — STONE ANALYSIS: Stone Mass: 15 mg

## 2021-05-21 NOTE — Telephone Encounter (Signed)
LMM TO CALL OFFICE-KSE

## 2021-05-21 NOTE — Telephone Encounter (Signed)
PT. CALLED TO TELL YOU THAT HE IS STILL HAVING BURNING WITH URINATION EVEN THOUGH HE WAS TOLD LAST WEEK THAT HIS MOST RECENT URINE CULTURE WAS NEGATIVE-PLEASE ADVISE-KSE

## 2021-05-22 NOTE — Telephone Encounter (Signed)
Pt is informed

## 2021-06-05 NOTE — Other (Signed)
LM for pt to call office

## 2021-06-20 ENCOUNTER — Ambulatory Visit: Admit: 2021-06-20 | Discharge: 2021-06-20 | Payer: MEDICARE | Attending: Specialist | Primary: Family Medicine

## 2021-06-20 DIAGNOSIS — R82991 Hypocitraturia: Secondary | ICD-10-CM

## 2021-06-20 LAB — POCT URINALYSIS DIPSTICK W/O MICROSCOPE (AUTO)
Glucose, UA POC: NEGATIVE
Nitrite, UA: NEGATIVE
Protein, UA POC: NEGATIVE

## 2021-06-20 MED ORDER — POTASSIUM CITRATE ER 10 MEQ (1080 MG) PO TBCR
101080 MEQ (80 MG) | ORAL_TABLET | Freq: Three times a day (TID) | ORAL | 11 refills | Status: AC
Start: 2021-06-20 — End: 2021-12-19

## 2021-06-20 NOTE — Progress Notes (Signed)
Tama Headings Sande Brothers, MD FACS    Urology Office Progress Note    Patient:  Jake Morales  Date of Birth: 20-Jan-1956  Date: 06/20/2021    HISTORY OF PRESENT ILLNESS:   The patient is a 65 y.o. male  Patient's 24 hour urine shows a low urine citrate levels.    Patient told to drink lemonade daily to increase urinary citrate levels and to decrease risk of kidney stones (1/4 cup of real lemon juice in 16-20 ounces of water, sweetened to taste).  Can also drink daily Diet Sprite or 7-up (clear sodas) to increase urinary citrate levels to prevent kidney stones.   Patient given an Rx for K citrate 20 mEq po tid to correct hypocitraturia and prevent kidney stones.    Did not get PSA prior to today's office visit as requested; will send to lab today to get this drawn.   Patient on Tadalafil (Cialis) 5 mg po qd for Erectile Dysfunction.    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      Additional History: none    Procedures Today: N/A    Urinalysis today:  Results for POC orders placed in visit on 06/20/21   POCT Urinalysis No Micro (Auto)   Result Value Ref Range    Color, UA yellow     Clarity, UA clear     Glucose, UA POC neg     Bilirubin, UA      Ketones, UA      Spec Grav, UA      Blood, UA POC trace-intact     pH, UA      Protein, UA POC neg     Urobilinogen, UA      Leukocytes, UA small     Nitrite, UA neg        Last several PSA's:  Lab Results   Component Value Date    PSA 0.05 06/20/2020    PSA 0.01  06/30/2019    PSA 0.01 06/16/2018       Last total testosterone:  No results found for: TESTOSTERONE    Last BUN and creatinine:  Lab Results   Component Value Date    BUN 17 12/03/2016     Lab Results   Component Value Date    CREATININE 0.95 12/03/2016       Last CBC:  Lab Results   Component Value Date    WBC 5.4 12/03/2016    HGB 12.2 (L) 12/03/2016    HCT 37.2 (L) 12/03/2016    MCV 85.1 12/03/2016    PLT 223 12/03/2016       Additional Lab/Culture results: none    Imaging Reviewed during this Office Visit: none  (results were independently reviewed by physician and radiology report verified)    Physical Exam:    There were no vitals filed for this visit.  Body mass index is 25.25 kg/m??.  Patient is a 65 y.o.  male in no acute distress and alert and oriented to person, place and time.      Assessment and Plan      1. Hypocitraturia    2. Nocturia    3. History of kidney stones    4. Personal history of malignant neoplasm of prostate           Plan:      Return in about 6 months (around 12/18/2021) for KUB.  Patient's 24 hour urine shows a low urine citrate levels.    Patient told to drink lemonade daily to increase urinary citrate levels and to decrease risk of kidney stones (1/4 cup of real lemon juice in 16-20 ounces of water, sweetened to taste).  Can also drink daily Diet Sprite or 7-up (clear sodas) to increase urinary citrate levels to prevent kidney stones.   Patient given an Rx for K citrate 20 mEq po tid to correct hypocitraturia and prevent kidney stones.    Did not get PSA prior to today's office visit as requested; will send to lab today to get this drawn.   Patient on Tadalafil (Cialis) 5 mg po qd for Erectile Dysfunction.       Tama Headings Arie Gable, MD FACS  06/20/2021 3:34 PM    2022 Quality Measure Documentation: N/A  Social History     Tobacco Use   Smoking Status Never   Smokeless Tobacco Never     (If patient a smoker, smoking cessation counseling offered)

## 2021-06-21 LAB — PSA, DIAGNOSTIC: PSA: 0.01 ng/mL

## 2021-06-25 ENCOUNTER — Encounter

## 2021-06-26 NOTE — Other (Signed)
LM for pt to call back office for result

## 2021-06-28 ENCOUNTER — Encounter

## 2021-07-05 MED ORDER — POTASSIUM CITRATE-CITRIC ACID 1100-334 MG/5ML PO SOLN
1100-334 MG/5ML | Freq: Three times a day (TID) | ORAL | 5 refills | Status: DC
Start: 2021-07-05 — End: 2021-07-05

## 2021-07-05 MED ORDER — POTASSIUM CITRATE-CITRIC ACID 1100-334 MG/5ML PO SOLN
1100-334 | Freq: Three times a day (TID) | ORAL | 5 refills | Status: DC
Start: 2021-07-05 — End: 2024-06-08

## 2021-07-05 NOTE — Telephone Encounter (Signed)
See Rx for potassium citrate solution 15 mL tid, #1 473 mL bottle is about 15$ a month using [facility]

## 2021-07-05 NOTE — Telephone Encounter (Signed)
Pt has been taking potassium citrate (UROCIT-K) 10 MEQ (1080 MG) extended release tablet  Sig - Route: Take 2 tablets by mouth 3 times daily (with meals) - Oral.    He called asking if there is anything that he can take that would be less costly than the over $60 / month that he is currently paying for this RX?    Pt last in office 06/20/2021, next appt. 12/19/2021. NB

## 2021-07-10 NOTE — Telephone Encounter (Signed)
Pt informed by RD.

## 2021-08-29 MED ORDER — SULFAMETHOXAZOLE-TRIMETHOPRIM 800-160 MG PO TABS
800-160 MG | ORAL_TABLET | Freq: Two times a day (BID) | ORAL | 0 refills | Status: AC
Start: 2021-08-29 — End: 2021-12-19

## 2021-08-29 NOTE — Telephone Encounter (Signed)
Pt called stating that his urine has a horrible odor and he had some frequency a few days ago.    Last time he had this problem he had a K/S and UTI.  Pt was last in office 06/20/2021, has an appt set for 12/19/2021.    Pt has NOT been taking K Citrate 20 mEQ TID religiously due to cost.    Pls advise NB

## 2021-08-29 NOTE — Telephone Encounter (Signed)
Get urine for C&S first then see eRx for Bactrim.

## 2021-08-30 ENCOUNTER — Encounter

## 2021-08-30 NOTE — Telephone Encounter (Signed)
Pt is informed and c&s slip faxed to [facility] as requested by pt

## 2021-09-03 ENCOUNTER — Encounter

## 2021-09-03 MED ORDER — SULFAMETHOXAZOLE-TRIMETHOPRIM 800-160 MG PO TABS
800-160 MG | ORAL_TABLET | Freq: Two times a day (BID) | ORAL | 0 refills | Status: AC
Start: 2021-09-03 — End: 2021-12-19

## 2021-09-04 NOTE — Other (Signed)
Added as allergy

## 2021-09-06 NOTE — Telephone Encounter (Signed)
Patient called to say that he stopped Potassium Citrate because of severe diarrhea and wonders if there is anything else he should be taking to prevent kidney stones.  Please advise.

## 2021-09-06 NOTE — Telephone Encounter (Signed)
Try Calcium citrate 600 mg po bid OTC and Lemonade daily to increase urinary citrate levels and to decrease risk of kidney stones and Recommended daily Diet Sprite or 7-up to correct his hypocitraturia to prevent kidney stones.

## 2021-10-05 MED ORDER — TADALAFIL 20 MG PO TABS
20 | ORAL_TABLET | ORAL | 2 refills | Status: DC
Start: 2021-10-05 — End: 2024-06-08

## 2021-10-05 NOTE — Telephone Encounter (Signed)
Last seen 06/20/21 and scheduled to return on 12/19/21.

## 2021-12-19 ENCOUNTER — Ambulatory Visit: Admit: 2021-12-19 | Discharge: 2021-12-19 | Payer: MEDICARE | Attending: Specialist | Primary: Family Medicine

## 2021-12-19 DIAGNOSIS — R82991 Hypocitraturia: Secondary | ICD-10-CM

## 2021-12-19 LAB — POCT URINALYSIS DIPSTICK W/O MICROSCOPE (AUTO)
Glucose, UA POC: NEGATIVE
Leukocytes, UA: NEGATIVE
Nitrite, UA: NEGATIVE
Protein, UA POC: NEGATIVE

## 2021-12-19 NOTE — Progress Notes (Signed)
Tama Headings Sande Brothers, MD FACS    Urology Office Progress Note    Patient:  Jake Morales  Date of Birth: [birth year]  Date: 12/19/2021    HISTORY OF PRESENT ILLNESS:   The patient is a 66 y.o. male  Patient using Tadalafil (Cialis) 20 mg prn ED.   Stable microhematuria with negative workup in the past.    Lemonade daily to increase urinary citrate levels and to decrease risk of kidney stones.   Resume Kegel exercises for his mild stress urinary incontinence.   Check PSA in 1 year to rule out recurrent prostate cancer.   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, 1 minipad per day  Microhematuria  ED: generic sildenafil 100 mg (dizzy), Rx for Tadalafil (Cialis) 20 mg prn ED 07/14/19  Left groin pain 01/19/20  03/30/21, 04/20/21, 08/30/21 Kleb pneumo UTI: 05/09/21 PVR = 2 mL  4 mm R ureteral calculus on 05/05/21 CT; passed; 05/28/21 [facility]ol[facility]: volume=2410 mL, Ca=50, oxalate=32, citrate=141  Hypocitraturia: Lemonade daily; K citrate 20 mEq po tid-caused diarrhea    Additional History: none    Procedures Today: N/A    Urinalysis today:  Results for POC orders placed in visit on 12/19/21   POCT Urinalysis No Micro (Auto)   Result Value Ref Range    Color, UA yellow     Clarity, UA clear     Glucose, UA POC neg     Bilirubin, UA      Ketones, UA      Spec Grav, UA      Blood, UA POC small     pH, UA      Protein, UA POC neg     Urobilinogen, UA      Leukocytes, UA neg     Nitrite, UA neg        Last several PSA's:  Lab Results   Component Value Date    PSA 0.01 06/21/2021    PSA 0.05 06/20/2020    PSA 0.01 06/30/2019       Last total testosterone:  No results found for: TESTOSTERONE    Last BUN and creatinine:  Lab Results   Component Value Date    BUN 17 12/03/2016     Lab Results   Component Value Date    CREATININE 0.95 12/03/2016       Last CBC:  Lab Results   Component  Value Date    WBC 5.4 12/03/2016    HGB 12.2 (L) 12/03/2016    HCT 37.2 (L) 12/03/2016    MCV 85.1 12/03/2016    PLT 223 12/03/2016       Additional Lab/Culture results:   Component 1 mo ago   Culture  NO GROWTH AT <1000 CFU/mL    Resulting Agency [facility]   Specimen Collected: 11/07/21 16:05 Last Resulted: 11/08/21 18:20   Received From: [facility]  Result Received: 12/19/21 15:14     Component 3 mo ago   Culture  >100,000 ORGANISMS/mL KLEBSIELLA PNEUMONIAE Abnormal     Culture 08/30/21 50,000 to 100,000 ORGANISMS/mL KLEBSIELLA PNEUMONIAE VARIANT Abnormal     Resulting Agency [facility]       Imaging Reviewed during this Office Visit:   X-ray abdomen 12/10/21  Impression:  No definitive abnormal calcifications seen overlying the kidneys or the course of the ureters.    (results were independently reviewed by physician and radiology report verified)    Physical Exam:    There were no vitals filed for this visit.  Body mass index is 25.25 kg/m??.  Patient is a 67 y.o. male in no acute distress and alert and oriented to person, place and time.    Assessment and Plan      1. Hypocitraturia    2. Nocturia    3. Microscopic hematuria    4. History of kidney stones    5. Personal history of malignant neoplasm of prostate           Plan:      Return in about 1 year (around 12/20/2022) for PSA, KUB.  Patient using Tadalafil (Cialis) 20 mg prn ED.   Stable microhematuria with negative workup in the past.    Lemonade daily to increase urinary citrate levels and to decrease risk of kidney stones.   Resume Kegel exercises for his mild stress urinary incontinence.   Check PSA in 1 year to rule out recurrent prostate cancer.        Tama Headings Lashan Macias, MD FACS  12/19/2021 3:41 PM    2023 Quality Measure Documentation:   Patient does not have an advance plan and does not want to name a durable power of attorney at this time.    Social History     Tobacco Use   Smoking Status Never    Smokeless Tobacco Never     (If patient a smoker, smoking cessation counseling offered)

## 2022-04-08 LAB — POCT GLUCOSE: POC Glucose: 95 mg/dL (ref 70–105)

## 2022-04-09 LAB — SURGICAL SPECIMEN

## 2022-05-01 NOTE — Progress Notes (Signed)
Formatting of this note is different from the original.    Subjective     Chief complaint:   Chief Complaint   Patient presents with    Left Hand - Post-op     05/01/22  Jake Morales is a 66 y.o. male presenting for evaluation of postop visit s/p excision of left thumb mass, DOS: 04/08/2022.  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  Since surgery, patient states that his pain has been controlled.  Denies any drainage or erythematous changes to the surgical site.  Denies any fevers or chills.  Of note, patient states that he did miss his initial follow-up appointment due to infection with COVID.  He subsequently rescheduled his first postop visit for today (05/01/2022).  Denies any numbness or tingling to the left thumb.  Denies any weakness or stiffness of this thumb.    ROS: Denies fevers, chills, and other constitutional symptoms. Denies shortness of breath.     Patient History   Past Surgical History:   Procedure Laterality Date    APPENDECTOMY      CHOLECYSTECTOMY      COLONOSCOPY      COLOSTOMY CLOSURE      FOOT SURGERY Bilateral     NERVE    HERNIA REPAIR      x 2    NASAL SEPTUM SURGERY      OTHER SURGICAL HISTORY      CORTISONE INJECTIONS TO BACK EVERY 3  MONTHS    PRESSURE ULCER DEBRIDEMENT      PROSTATECTOMY      SMALL INTESTINE SURGERY       Past Medical History:   Diagnosis Date    Bowel obstruction (CMS/HCC)     Cancer (CMS/HCC)     PROSTATE    Hyperlipidemia     Hypothyroidism     Stomach ulcer        Objective     General:  Body mass index is 24.54 kg/m.   There were no vitals filed for this visit.     No acute distress, comfortable  Respiratory: Unlabored breathing with normal rate, no cough  Cardiovascular: Warm well perfused extremities  Psych: Appropriate mood behavior      Left hand:   Inspection-minimal swelling, no erythema, no deformity.  Surgical incision appears to be well-healed and intact with no signs of dehiscence or infection.  Mild TTP over surgical incision    Strength:  grip 5/5, thumb deferred, interossei 5/5  Sensation: intact over median, ulnar, and radial nerve distributions    Cardiovascular: digits WWP    Imaging:  No new    Assessment/Plan     Jake Morales is a 66 y.o. male who presents for postop visit s/p excision of synovial cyst of left, DOS: 04/08/2022.    -Sutures removed today in clinic  -Vitamin E, scar massage  -RTC as needed if symptoms worsen or fail to improve  - Call office any questions or concerns    Jamie Kato, MD  Orthopedic Surgery Resident, PGY-2  Pager: [phone]  05/01/22 9:40 PM    By using the attestations below, the signing clinician agrees that "I have read and verify that the documentation has been personally reviewed by me and ensure that the documentation accurately reflects the encounter."    GC: I personally saw this patient on the day of the encounter, performed the key portion(s) of the service and participated in the management and confirm the resident's documentation. Please note  there may be an additional personal documentation from me.     Electronically signed by Jacqulyn Ducking, MD at 05/02/2022  8:32 AM EDT    Associated attestation - Jacqulyn Ducking, MD - 05/02/2022  8:32 AM EDT  Formatting of this note might be different from the original.  I personally saw and examined the patient on the same date of service as resident/fellow  . I discussed the findings and therapeutic plan with the resident/fellow  . I agree with the documentation, except for any edits/updates below.    Teaching Physician's Revisions: None

## 2022-05-22 LAB — PSA, DIAGNOSTIC: PSA: 0.06 ng/mL

## 2022-06-04 NOTE — Telephone Encounter (Signed)
Formatting of this note might be different from the original.  No previous documentation  Electronically signed by Dionisio Paschal, MD at 06/21/2022  2:21 PM EDT

## 2022-07-29 ENCOUNTER — Encounter

## 2022-07-31 ENCOUNTER — Encounter: Admit: 2022-07-31 | Discharge: 2022-07-31 | Payer: MEDICARE | Attending: Specialist | Primary: Family Medicine

## 2022-07-31 DIAGNOSIS — R82991 Hypocitraturia: Secondary | ICD-10-CM

## 2022-07-31 LAB — POCT URINALYSIS DIPSTICK W/O MICROSCOPE (AUTO)
Glucose, UA POC: NEGATIVE
Leukocytes, UA: NEGATIVE
Nitrite, UA: NEGATIVE
Protein, UA POC: NEGATIVE

## 2022-07-31 NOTE — Progress Notes (Signed)
Tama Headings Sande Brothers, MD [facility]    [facility] Urology Office Progress Note    Patient:  Jake Morales  Date of Birth: 1956/03/14  Date: 07/31/2022    HISTORY OF PRESENT ILLNESS:   The patient is a 66 y.o. male  No flank pain or gross hematuria to suggest recurrent kidney stones.   Patient's 07/25/22 KUB was negative.  Patient instructed to drink at least 10 eight ounce glasses of water a day to decrease risk of recurrent kidney stones.   Continue Polycitra solution and/or lemonade to correct hypocitraturia and prevent kidney stones.   Stable microhematuria on today's UA with negative workup in the past.  Thus, no further workup required.    No evidence of prostate cancer recurrence s/p 01/13/01 open radical prostatectomy since PSA is <0.06.   Lower urinary tract symptoms: urgency, frequency, decreased urinary stream, nocturia, 1 times per night, and feelings of IBE.    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, 1 minipad per day  Microhematuria  ED: generic sildenafil 100 mg (dizzy), Rx for Tadalafil (Cialis) 20 mg prn ED 07/14/19  Left groin pain 01/19/20  03/30/21, 04/20/21, 08/30/21 Kleb pneumo UTI: 05/09/21 PVR = 2 mL  4 mm R ureteral calculus on 05/05/21 CT; passed; 05/28/21 [facility]olink: volume=2410 mL, Ca=50, oxalate=32, citrate=141  Hypocitraturia: Lemonade daily; K citrate 20 mEq po tid-caused diarrhea; Polycitra solution    Additional History: none    Procedures Today: N/A    Urinalysis today:  Results for POC orders placed in visit on 07/31/22   POCT Urinalysis No Micro (Auto)   Result Value Ref Range    Color, UA yellow     Clarity, UA clear     Glucose, UA POC neg     Bilirubin, UA      Ketones, UA      Spec Grav, UA      Blood, UA POC small     pH, UA      Protein, UA POC neg     Urobilinogen, UA      Leukocytes, UA neg     Nitrite, UA neg         Last several PSA's:  Lab Results   Component Value Date    PSA 0.06 05/22/2022    PSA 0.01 06/21/2021    PSA 0.05 06/20/2020       Last total testosterone:  No results found for: "TESTOSTERONE"    Last BUN and creatinine:  Lab Results   Component Value Date    BUN 21 04/05/2020     Lab Results   Component Value Date    CREATININE 1.13 04/05/2020       Last CBC:  Lab Results   Component Value Date    WBC 5.4 12/03/2016    HGB 12.2 (L) 12/03/2016    HCT 37.2 (L) 12/03/2016    MCV 85.1 12/03/2016    PLT 223 12/03/2016       Additional Lab/Culture results: none    Imaging Reviewed during this Office Visit: KUB 07/25/22  IMPRESSION:  Nonobstructive bowel gas pattern.     No coarse renal stone is confirmed.     Stable sclerotic changes of left iliac bone favored to represent process.  (results were independently reviewed by physician and radiology  report verified)    Physical Exam:    There were no vitals filed for this visit.  There is no height or weight on file to calculate BMI.  Patient is a 66 y.o. male in no acute distress and alert and oriented to person, place and time.    Assessment and Plan      1. Hypocitraturia    2. Erectile dysfunction after radical prostatectomy    3. Nocturia    4. Personal history of malignant neoplasm of prostate    5. History of kidney stones           Plan:      Return if symptoms worsen or fail to improve.  No flank pain or gross hematuria to suggest recurrent kidney stones.   Patient's 07/25/22 KUB was negative.  Patient instructed to drink at least 10 eight ounce glasses of water a day to decrease risk of recurrent kidney stones.   Continue Polycitra solution and/or lemonade to correct hypocitraturia and prevent kidney stones.   Stable microhematuria on today's UA with negative workup in the past.  Thus, no further workup required.    No evidence of prostate cancer recurrence s/p 01/13/01 open radical prostatectomy since PSA is <0.06.        Jake Specter Lanya Bucks, MD  FACS  07/31/2022 6:45 PM    2023 Quality Measure Documentation: N/A  Social History     Tobacco Use   Smoking Status Never   Smokeless Tobacco Never     (If patient a smoker, smoking cessation counseling offered)

## 2022-08-15 ENCOUNTER — Encounter

## 2022-08-15 NOTE — Telephone Encounter (Signed)
Patient left voicemail that his urine has a foul odor, saying it is not going away and doesn't appear to be anything he ate. Would like an order for a culture, ok to place? ([facility] on [address])

## 2022-08-19 ENCOUNTER — Encounter

## 2023-07-17 ENCOUNTER — Telehealth

## 2023-07-17 NOTE — Telephone Encounter (Signed)
Patient called and said that [facility] needed a new PSA order due to date was wrong on old order. Writer placed PSA order & faxed to [facility]: Fax # [phone]

## 2023-07-18 LAB — PSA, DIAGNOSTIC: PSA: 0.01 ng/mL

## 2023-08-06 ENCOUNTER — Encounter: Admit: 2023-08-06 | Discharge: 2023-08-06 | Payer: MEDICARE | Attending: Specialist | Primary: Family Medicine

## 2023-08-06 DIAGNOSIS — R82991 Hypocitraturia: Secondary | ICD-10-CM

## 2023-08-06 LAB — POCT URINALYSIS DIPSTICK W/O MICROSCOPE (AUTO)
Glucose, UA POC: NEGATIVE mg/dL
Leukocytes, UA: NEGATIVE
Nitrite, UA: NEGATIVE
Protein, UA POC: NEGATIVE mg/dL

## 2023-08-06 NOTE — Progress Notes (Signed)
Tama Headings Tifani Dack, MD [facility]    [facility] Urology Office Progress Note    Patient:  Jake Morales  Date of Birth: 1956/01/30  Date: 08/06/2023    HISTORY OF PRESENT ILLNESS:   The patient is a 67 y.o. male  No evidence of prostate cancer recurrence s/p 01/13/01 open radical prostatectomy since PSA is 0.01.  Repeat PSA in 1 year.  Patient has mild residual Stress urinary incontinence.  He was told to continue Kegel exercises.  Patient has a history of a bladder neck contracture in the past but denies any obstructive voiding symptoms.  Will check uroflow and Postvoid Residual at next OV.  No flank pain or gross hematuria to suggest recurrent kidney stones.   Continue Lemonade daily to increase urinary citrate levels and to decrease risk of kidney stones.    Will check KUB prior to next OV.  Lower urinary tract symptoms: urgency, frequency, hesitancy, decreased urinary stream, nocturia, 2 times per night, and feeling of IBE.   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, 1 minipad per day  Microhematuria  ED: generic sildenafil 100 mg (dizzy), Rx for Tadalafil (Cialis) 20 mg prn ED 07/14/19  Left groin pain 01/19/20  03/30/21, 04/20/21, 08/30/21 Kleb pneumo UTI: 05/09/21 PVR = 2 mL  4 mm R ureteral calculus on 05/05/21 CT; passed; 05/28/21 [facility]olink: volume=2410 mL, Ca=50, oxalate=32, citrate=141  Hypocitraturia: Lemonade daily; K citrate 20 mEq po tid-caused diarrhea; Polycitra solution    Additional History: none    Procedures Today: N/A    Urinalysis today:  Results for orders placed or performed in visit on 08/06/23   POCT Urinalysis No Micro (Auto)   Result Value Ref Range    Color, UA yellow     Clarity, UA clear     Glucose, UA POC neg mg/dL    Bilirubin, UA      Ketones, UA      Spec Grav, UA      Blood, UA POC trace-lysed     pH, UA      Protein, UA  POC neg mg/dL    Urobilinogen, UA      Leukocytes, UA neg     Nitrite, UA neg        Last several PSA's:  Lab Results   Component Value Date    PSA 0.01 07/18/2023    PSA 0.06 05/22/2022    PSA 0.01 06/21/2021       Last total testosterone:  No results found for: "TESTOSTERONE"    Last BUN and creatinine:  Lab Results   Component Value Date    BUN 21 04/05/2020     Lab Results   Component Value Date    CREATININE 1.13 04/05/2020       Last CBC:  Lab Results   Component Value Date    WBC 5.4 12/03/2016    HGB 12.2 (L) 12/03/2016    HCT 37.2 (L) 12/03/2016    MCV 85.1 12/03/2016    PLT 223 12/03/2016       Additional Lab/Culture results:   08/15/22 Urine C & S - Negative    Imaging Reviewed during this Office Visit: none  (results were independently reviewed by physician and radiology report verified)    Physical Exam:    There were  no vitals filed for this visit.  There is no height or weight on file to calculate BMI.  Patient is a 67 y.o. male in no acute distress and alert and oriented to person, place and time.    Assessment and Plan   Assessment   1. Hypocitraturia    2. Erectile dysfunction after radical prostatectomy    3. Nocturia    4. Personal history of malignant neoplasm of prostate    5. History of kidney stones            Plan    Return in about 1 year (around 08/05/2024) for Uroflow, PVR by U/S, PSA.  No evidence of prostate cancer recurrence s/p 01/13/01 open radical prostatectomy since PSA is 0.01.  Repeat PSA in 1 year.  Patient has mild residual Stress urinary incontinence.  He was told to continue Kegel exercises.  Patient has a history of a bladder neck contracture in the past but denies any obstructive voiding symptoms.  Will check uroflow and Postvoid Residual at next OV.  No flank pain or gross hematuria to suggest recurrent kidney stones.   Continue Lemonade daily to increase urinary citrate levels and to decrease risk of kidney stones.    Will check KUB prior to next OV.         Tama Headings  Inis Borneman, MD FACS  08/06/2023 4:46 PM    2024 Quality Measure Documentation:   Patient has an advance care plan and/or living will and the durable power of attorney (POA) is wife, Britta Mccreedy.    Social History     Tobacco Use   Smoking Status Never   Smokeless Tobacco Never     (If patient a smoker, smoking cessation counseling offered)

## 2023-09-01 LAB — EOSINOPHIL COUNT: Eosinophils: 0.33 10*3/uL

## 2023-09-01 LAB — IGE: IgE: 8.22 kU/L

## 2023-09-01 LAB — PROBNP, N-TERMINAL

## 2023-10-22 ENCOUNTER — Inpatient Hospital Stay: Payer: MEDICARE | Primary: Student in an Organized Health Care Education/Training Program

## 2023-10-22 ENCOUNTER — Encounter

## 2023-10-22 DIAGNOSIS — N3000 Acute cystitis without hematuria: Secondary | ICD-10-CM

## 2023-10-22 MED ORDER — SULFAMETHOXAZOLE-TRIMETHOPRIM 800-160 MG PO TABS
800-160 | ORAL_TABLET | Freq: Two times a day (BID) | ORAL | 0 refills | Status: DC
Start: 2023-10-22 — End: 2024-06-08

## 2023-10-22 NOTE — Telephone Encounter (Signed)
Please place order for Urine C & S/ thanks

## 2023-10-22 NOTE — Telephone Encounter (Signed)
Get urine for C&S first then see eRx for Bactrim.

## 2023-10-22 NOTE — Telephone Encounter (Signed)
Order already in chart

## 2023-10-22 NOTE — Telephone Encounter (Signed)
Patient called.  He is having increasing incontinence and urine odor the last couple of weeks.  He thinks he might have a UTI.Marland Kitchen  Also he has been doing Kegel exercises but they don't seem to help.  He was last seen Nov 2024.  Please advise

## 2023-10-22 NOTE — Telephone Encounter (Signed)
Patient notified

## 2023-10-23 LAB — CULTURE, URINE: Culture: NO GROWTH

## 2023-10-24 ENCOUNTER — Encounter

## 2024-03-18 ENCOUNTER — Inpatient Hospital Stay
Admit: 2024-03-18 | Discharge: 2024-03-18 | Payer: Medicare (Managed Care) | Primary: Student in an Organized Health Care Education/Training Program

## 2024-03-18 DIAGNOSIS — N393 Stress incontinence (female) (male): Principal | ICD-10-CM

## 2024-03-18 NOTE — Other (Cosign Needed)
 Physical Therapy Evaluation  [facility]- [facility]  [address]  P: [phone]   F: [phone]       Date:  03/18/2024  Patient: Jake Morales    DOB: 22-Nov-1955    MRN: [id number]  Referring Provider:  Cordella JONETTA Hidden, MD    Insurance/Therapy Benefits:   [facility] Medicare Complete.  Auth required after eval thru [employer], $45 co- pay  Medical Diagnosis: N39.3ICD-10-CMStress incontinence, male        Rehab Codes:   Rehab Diagnosis ICD-10 Yes   Muscle weakness M62.81 [x]    Lack of coordination  R27.8 []    Muscle spasm M62.838 []    Pelvic pain R10.2 []    Lumbar pain M54.59 []    Generalized abdominal pain R10.84 []    Rectal pain K62.89 []    Mixed stress and urge incontinence N39.46 []    Stress incontinence N39.3 [x]    Urge incontinence N39.41 []    Fecal incontinence R15.9 []    Dyspareunia  N94.1 []    Urgency of urination R39.15 []    Frequency of urination  R35.0 []    Post void dribbling N39.43 []    Scar condition and fibrosis of skin L90.5 []    Constipation  K59.0 []    Rectocele  N81.6 []    Cystocele N81.1 []    Uterovaginal prolapse N81.2 []      []      []    Onset Date: 10/24/2023    Next Dr.'s appt.: Oct 2025    Time In: 12:30  Time Out: 1:25       Treatment:  Treatment Charges Mins Units Time In/Out (workers comp or Barrister's clerk)   []  Manual Therapy (97140)      [x]  Therapeutic Activity (97530) 40 3    [] Neuromuscular Reed [phone])      [x]  PT Eval         [x]  Low 97161       []  Moderate 97162       []  High  97163 15 1    []  Caregiver Training (02449)      []  Gait Training (02883)      []  PT Re-Eval (02835)      []  Therapeutic Exercise (97110)      []        Total Treatment Time:  ---------------- ----------------------------------------        Precautions:  Past colon resections, history of bowel blockages past open radical prostatectomy 2001.LBP and injections at pain clinic  Jake Morales, 68 y.o., male presents today for PT evaluation of pelvic floor weakness  and UI. He works 2 part time jobs and also as a Agricultural consultant for [employer].  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  Increased UI started about 6-8 months ago- he noted a wet pad and needing to change a pad during his 4 hour shift as a custodian. Some UI without warning as well as with lift, bend.  No c/o pelvic pain or pressure. He reports he has had light UI with cough or sneeze or strong laugh since his prostatectomy, but the increased UI was new.  Since then, he has had some improvement- he questions if the UI was in part due to back injections. Still some UI without warning as well as with bend, lift.     Pt's concerns are for UI     Jake Morales  states he drinks 50 oz of water per day, milk, occ juice    Pain:  []  Yes  [x]  No   Location: NA    Pain Rating: (0-10 scale) 0/10  Pain altered Tx:  [x]  No  []  Yes  Action:  Comments:    Past Medical and Surgical History of relevance:   Past Surgical History:   Procedure Laterality Date    APPENDECTOMY      BLADDER SURGERY      [facility]nc 06-13-2009    CHOLECYSTECTOMY      COLECTOMY      COLONOSCOPY      COLOSTOMY      PROSTATE BIOPSY      11-28-2000    PROSTATECTOMY      01-13-2001    REVISION COLOSTOMY       Past Medical History:   Diagnosis Date    Bowel obstruction (HCC) 12/2017    Chronic back pain     Diverticulosis     Hypercholesteremia     Knee pain     Peptic ulcer     Personal history of prostate cancer     Prostatitis, acute        Pregnancy and Delivery:   OB History   No obstetric history on file.        Social History:  Social History     Socioeconomic History    Marital status: Married   Tobacco Use    Smoking status: Never    Smokeless tobacco: Never   Substance and Sexual Activity    Alcohol use: Yes     Alcohol/week: 0.0 standard drinks of alcohol     Comment: SOCIAL    Drug use: No     Social Drivers of Health     Food Insecurity: No Food Insecurity (08/18/2022)    Received  from [facility]    Hunger Screening     Within the past 12 months we worried whether our food would run out before we got money to buy more.: Never True     Within the past 12 months the food we bought just didn't last and we didn't have money to get more.: Never True   Stress: No Stress Concern Present (01/05/2022)    Received from [facility] [employer] [employer] [facility] - Occupational Stress Questionnaire     Feeling of Stress : Only a little    Received from [facility]    [place] Safety & Environment        Current complaint/Symptoms:  Pt reports the severity of Bladder to be 5.  (0 being no pain and 10 being Significant problem)  Urinary - leaks during sleep  stress incontinence   Bowel - varies due to past bowel resection. Uses probiotic, no FI or urgency.     Complaint longevity:  6 months to 1 year    Symptom progression:  improving    Urinary frequency:  5x/ day  1x/ night      Pad use:  incontinence pads: 1- 2 per day    Pad wetness:   damp    Functional limitations:  cannot lift/carry objects without leaking  ADLs limited by urinary incontinence    Chaperone for Intimate Exam  Chaperone was offered as part of the rooming process. Patient declined and agrees to continue with exam without a chaperone.  Rectal exam deferred.  Palpation through clothing shows over use  of abdominals and gluteals with pelvic floor contractions- better with sub max contractions. Can hold over 3-4 seconds.  Quick Kegels show fair coordination and decreased endurance.      EMG Evaluation of pelvic floor musculature  Not performed at this visit    Treatment 03/18/2024   [x]  Initiated pt education including anatomy and physiology of pelvic floor and it's relevance to bladder symptoms.  [x]  Initiated pt education including lifestyle modifications pertinent to management of his condition.   [x]  Initiated treatment techniques  [x]  Initiated HEP, to assist pt in  taking an active role in rehabilitation and progress towards goals.     Education 03/18/2024            Patient provided education and received handouts on the following:  [x]  Kegel HEP- sit or supine 7 second hold and 7 quick Kegels  [x]  Relaxed Voiding-DV  []  Pelvic floor muscle release  []  Diaphragmatic Breathing  []  Bladder Urge Inhibition  []  Healthy bowel Habits  []  How to have an easier BM (technique)  []  Self massage  [x]  Specific Instruction: decrease dairy after dinner      Pt. Education:  [x]  Plans/Goals, Risks/Benefits discussed  [x]  Home exercise program  Method of Education: [x]  Verbal  [x]  Demo  [x]  Written  Comprehension of Education:  [x]  Verbalizes understanding.  [x]  Demonstrates understanding.  []  Needs Review.  [x]  Demonstrates/verbalizes understanding of HEP/Ed previously given.    Assessment    Pt presents with the following behavioral/functional findings:   Decreased dairy after dinner  Pt presents with the following findings for pelvic orthopedic and skeletal findings:  LBP limitations    Pt presents with the following findings for pelvic floor motor function:  Weakened pelvic floor, cannot brace for functional tasks.    Plan of Care completed.    Patient would benefit from skilled physical therapy services in order to: decreased UI and need for pads.      Rehab Potential:  [x]  Good  []  Fair  []  Poor   Suggested Professional Referral:  [x]  No  []  Yes:  Barriers to Goal Achievement:  [x]  No  []  Yes:  Domestic Concerns:  [x]  No  []  Yes:        GOALS: To be met in 3 visits  Functional Goals  Progress   4/5 pelvic floor strength and endurance for decrease need for pads to 0-1 in 24 hours      Decrease UI by 80% with all work activities per patient reports    I with pelvic brace with lift, bend, back exercises            Physical Therapy Plan of Treatment (may include)    [x]  Therapeutic Exercise   97110  [x]  Therapeutic Activity  97530   [x]  Neuromuscular Re-education  97112 []  Ultrasound   02964    [x]  Manual Therapy  97140 [x]  Instruction in HEP    []  Biofeedback, first 15 minutes   90912  []  Biofeedback, additional 15 minutes   90913        PLAN- BF in sitting, increase to 10 fast and slow, stand, with lift, bend, back exercises    Patient's goal is less UI      PT Recommendations:  SIDHANT HELDERMAN will be seen for therapy as described at the following frequency and duration:  1 Visit per week for 3 Weeks     03/18/24 8:17 AM EDT review of systems completed   Plan of care  reviewed with PTA    If you have any questions or concerns, please don't hesitate to call.  Thank you for your referral.      Electronically signed by: Greig Potts, PT          Physician Signature:______________________________________ DATE:_______________    By signing above or cosigning this note, I have reviewed this plan of care and certify a need for medically necessary rehabilitation services.    ++ PLEASE SIGN ABOVE AND FAX BACK ALL PAGES++

## 2024-03-25 ENCOUNTER — Encounter: Payer: Medicare (Managed Care) | Primary: Student in an Organized Health Care Education/Training Program

## 2024-04-08 ENCOUNTER — Encounter: Payer: Medicare (Managed Care) | Primary: Student in an Organized Health Care Education/Training Program

## 2024-04-08 ENCOUNTER — Inpatient Hospital Stay
Admit: 2024-04-08 | Discharge: 2024-04-08 | Payer: Medicare (Managed Care) | Primary: Student in an Organized Health Care Education/Training Program

## 2024-04-08 DIAGNOSIS — N393 Stress incontinence (female) (male): Principal | ICD-10-CM

## 2024-04-08 NOTE — Other (Addendum)
 Physical Therapy Treatment Note   [facility]  [address]  P: [phone]   F: [phone]     Date:  04/08/2024  Patient: Jake Morales    DOB: [birth year]    MRN: [id number]  Referring Provider:  Cordella JONETTA Hidden, MD    Expand All Collapse All      Physical Therapy Evaluation/  [facility]- [facility]  [address]  [place]: [phone]   F: [phone]         Date:  04/08/2024  Patient: Jake Morales               DOB: 1955/10/09                        MRN: [id number]  Referring Provider:  Cordella JONETTA Hidden, MD        Insurance/Therapy Benefits:   [facility] Medicare Complete.  Auth required after eval thru [employer], $45 co- pay  Medical Diagnosis: N39.3ICD-10-CMStress incontinence, male                           Rehab Codes:   Rehab Diagnosis ICD-10 Yes   Muscle weakness M62.81 [x]     Lack of coordination  R27.8 []     Muscle spasm M62.838 []     Pelvic pain R10.2 []     Lumbar pain M54.59 []     Generalized abdominal pain R10.84 []     Rectal pain K62.89 []     Mixed stress and urge incontinence N39.46 []     Stress incontinence N39.3 [x]     Urge incontinence N39.41 []     Fecal incontinence R15.9 []     Dyspareunia  N94.1 []     Urgency of urination R39.15 []     Frequency of urination  R35.0 []     Post void dribbling N39.43 []     Scar condition and fibrosis of skin L90.5 []     Constipation  K59.0 []     Rectocele  N81.6 []     Cystocele N81.1 []     Uterovaginal prolapse N81.2 []         []         []     Onset Date: 10/24/2023                        Next Dr.'s appt.: Oct 2025        Visit# / total visits: 2/3; Progress note for Medicare patient due at visit 3     Cancels/No Shows: 0/0    Time In: 10:30  Time Out: 10:53    Treatment:  Treatment Charges Mins Units Time In/Out (workers comp or Barrister's clerk)   []  Manual Therapy (97140)      [x]  Therapeutic Activity (97530) 38 3    []  Self Care/Home Management Training  [phone])      []  Therapeutic Exercise (97110)      [x]  Neuromuscular Reed (97112) 15 1    []  Gait Training (02883)      []  PT-Eval (97161, [id number], [id number])      []  PT Re-Eval (02835)      []  Caregiver Training (02449)      Total Treatment Time: 53 --------------------- --------------------------------------       Subjective:  Pain:  [x]  Yes  []  No   Location: LB    Pain  Rating: (0-10 scale) 5/10  Pain altered Tx:  [x]  No  []  Yes  Action:    Pt reports:  compliance with home program  He states he was almost completely dry until another back injection earlier this week.  Then has had UI without warning ( just even sitting still). Less wet than before doing exercises, but pad is wet. Seeing a neurosurgeon about his back and leg issues.  Objective:  Chaperone for Intimate Exam  Chaperone was offered as part of the rooming process. Patient declined and agrees to continue with exam without a chaperone.    Precautions []  No  X YES- LBP     Therapeutic exercise:  Progressed to standing and functional Kegels- with bend, lift, gym exercises.Increased to goal of 10 second hold and 10 quick if in static sit or stand.      EMG biofeedback/ Pelvic floor exercises:  Baseline is .3. Able to hold at 3.7 for 6 seconds and good coordination with 5 quick contractions.  Endurance is still limited, but improving.    Patient education:  [x]  Yes  []  No  [x]  Reviewed Prior HEP/Ed  Pt given home program education mz:tpuy bend, lift, gym exercises.Increased to goal of 10 second hold and 10 quick if in static sit or stand. To ask more if pain injections/LB issues can contribute to UI flare.  Method of Education: [x]  Verbal  [x]  Demo  [x]  Written  Comprehension of Education:  [x]  Verbalizes understanding.  [x]  Demonstrates understanding.  []  Needs review.  [x]  Demonstrates/verbalizes HEP/Ed previously given.    Assessment:  Patient is consistent with HEP and appears motivated for improvement    Progression toward goals: 04/08/24       Functional  Goals  Progress   4/5 pelvic floor strength and endurance for decrease need for pads to 0-1 in 24 hours     met- he ?s increase after his back pain injections   Decrease UI by 80% with all work activities per patient reports  met   I with pelvic brace with lift, bend, back exercises met           Pt tolerated therapy session Well with good understanding of HEP .      Plan: DC to updated HEP - he is I with technique. Follow up with physicians as needed         04/08/24 8:42 AM EDT review of systems completed                      Electronically signed by Greig Potts, PT on 04/08/2024 at 8:42 AM

## 2024-05-10 ENCOUNTER — Ambulatory Visit
Admit: 2024-05-10 | Discharge: 2024-05-10 | Payer: Medicare (Managed Care) | Attending: Neurological Surgery | Primary: Student in an Organized Health Care Education/Training Program

## 2024-05-10 VITALS — BP 136/80 | HR 79 | Ht 70.0 in | Wt 176.0 lb

## 2024-05-10 DIAGNOSIS — M48062 Spinal stenosis, lumbar region with neurogenic claudication: Principal | ICD-10-CM

## 2024-05-10 NOTE — Progress Notes (Signed)
 Review of Systems   Musculoskeletal:  Positive for back pain.   All other systems reviewed and are negative.

## 2024-05-10 NOTE — Progress Notes (Signed)
 [facility], [facility]  [facility] [place], [facility]  [address], [address]  [place] [address]  Dept: [phone]  Dept Fax: [phone]     Patient:  Jake Morales  Date of Birth: Sep 01, 1956  Date: 05/10/24      Chief Complaint   Patient presents with    New Patient     Lumbar Pain, MRI [email or web address]  02/25/2024           HPI:     I had the pleasure of seeing this patient in the office today for primary complaints of back as well as intermittent lower extremity discomfort and numbness.  As you know the patient is a 68 year old male who presents with approximate 4-year history of ongoing discomfort and symptoms.  He describes intermittent discomfort in the lower back extending into the buttock and hips.  He describes a degree of discomfort as well as numbness coursing the both lower extremities predominately along the posterior lateral thigh and calf regions.  His symptoms are worse in the left lower extremity compared to the right.  His symptoms are more pronounced with standing ambulation and partially improved with rest.  In addition he has noticed a degree of urinary frequency over the past year or so.  Thus far the patient has been through extensive conservative measures.  He indicates he has been working with pain management with epidural steroid injections over the past 3 years.  His most recent set of injections have not helped his condition significantly.  He underwent a lumbar MRI which demonstrated significant spinal stenosis and now is being referred to neurosurgery for consultation.        History:     Past Medical History:   Diagnosis Date    Bowel obstruction (HCC) 12/2017    Chronic back pain     Diverticulosis     Hypercholesteremia     Knee pain     Peptic ulcer     Personal history of prostate cancer     Prostatitis, acute      Past Surgical History:   Procedure Laterality Date    APPENDECTOMY      BLADDER SURGERY      tuibnc  06-13-2009    CHOLECYSTECTOMY      COLECTOMY      COLONOSCOPY      COLOSTOMY      PROSTATE BIOPSY      11-28-2000    PROSTATECTOMY      01-13-2001    REVISION COLOSTOMY       Family History   Family history unknown: Yes     Current Outpatient Medications on File Prior to Visit   Medication Sig Dispense Refill    citric acid -potassium citrate  (POLYCITRA) 1100-334 MG/5ML solution Take 15 mLs by mouth 3 times daily (with meals) 473 mL 5    levothyroxine (SYNTHROID) 50 MCG tablet       Polyethylene Glycol 3350 (MIRALAX PO) Take by mouth      Probiotic Product (PROBIOTIC PO) Take by mouth      Multiple Vitamins-Minerals (THERAPEUTIC MULTIVITAMIN-MINERALS) tablet Take 1 tablet by mouth daily      sulfamethoxazole -trimethoprim  (BACTRIM  DS) 800-160 MG per tablet Take 1 tablet by mouth 2 times daily 14 tablet 0    tadalafil  (CIALIS ) 20 MG tablet TAKE ONE TABLET BY MOUTH DAILY AS NEEDED FOR ERECTILE DYSFUNCTION 20 tablet 2    bisacodyl (DULCOLAX) 5 MG EC tablet Take 10 mg by  mouth See Admin Instructions (Patient not taking: Reported on 05/10/2024)      bacitracin -polymyxin b (POLYSPORIN) 500-10000 UNIT/GM ophthalmic ointment Apply to eye nightly (Patient not taking: Reported on 05/10/2024)      metroNIDAZOLE (METROCREAM) 0.75 % cream APPLY CREAM TOPICALLY ONCE DAILY TO AFFECTED AREA OF FACE (Patient not taking: Reported on 05/10/2024)      aspirin 81 MG chewable tablet Take 1 tablet by mouth daily Indications: couple times weekly (Patient not taking: Reported on 05/10/2024)       No current facility-administered medications on file prior to visit.     Social History     Tobacco Use    Smoking status: Never    Smokeless tobacco: Never   Substance Use Topics    Alcohol use: Yes     Alcohol/week: 0.0 standard drinks of alcohol     Comment: SOCIAL    Drug use: No       Allergies   Allergen Reactions    Citric Acid -Potassium Citrate  [Potassium Citrate ] Diarrhea       Review of Systems:     I reviewed the review of systems as documented by  clinical staff.      Physical Exam:      BP 136/80 (BP Site: Left Upper Arm, Patient Position: Sitting, BP Cuff Size: Large Adult)   Pulse 79   Ht 1.778 m (5' 10)   Wt 79.8 kg (176 lb)   BMI 25.25 kg/m     General examination: The patient is awake, alert, in no apparent distress.    Back: Patient has no tenderness on palpation over the bony lumbar spine and adjacent paraspinal musculature. Sacroiliac joints are nontender bilaterally.    Musculoskeletal: Hips are nontender bilaterally.  Patrick's testing is negative. Straight leg raise is negative to 90 degrees bilaterally.    Extremities: Hands are warm to touch, radial pulses are 2+. There is no evidence of edema, cyanosis, or clubbing.    Neurologic: The patient is awake, alert, they follow commands briskly, with clear speech. Comprehension appears normal. There are no focal cranial neurologic deficits to my examination. The patient's pupils are 3 mm, equal, round and reactive to light. The patient has full extraocular eye movements, conjugate gaze is full. Facial sensation is intact, facial expression is symmetric. Tongue is midline. Hearing is intact bilaterally. Shoulder shrug is 5 out of 5 equal bilaterally. The patient has normal strength throughout bilateral upper and lower extremities. The patient has normal sensation in both upper and lower extremities. Reflexes are +1 at the patella and 1+ at the biceps bilaterally.  No clonus or hyperreflexia on examination. Gait is steady.      Studies Review:     A recent MRI of the lumbar spine performed Feb 25, 2024 is reviewed.  This study is most notable for severe focal spinal stenosis at the L3-4 and L4-5 levels with more early moderate central stenosis at the L2-3 level.  Additionally the patient does have marked degenerative disc disease throughout most levels of the lumbar spine including associated grade 1 spondylolisthesis both at the L3-4 and L4-5 levels.    Assessment and Plan:     1. Spinal stenosis  of lumbar region with neurogenic claudication    2. Spondylolisthesis at L4-L5 level        Plan: Patient is a 68 year old male who presents with a 4-year history of progressive back, buttock and intermittent lower extremity discomfort and numbness consistent with ongoing lumbar radiculopathy.  Radiographic imaging  in the form of MRI lumbar spine clearly demonstrates severe focal spinal stenosis both at the L3-4 and L4-5 levels in addition to a grade 1 spondylolisthesis at each level.  I did review these images in the office today with the patient.  I demonstrated the radiograph findings as described above.  Further treatment options were discussed conservative measures versus surgical intervention. Conservative measures discussed included medications, physical therapy, and/or pain management for epidural steroid injections. All of the above treatment options were discussed. The option of surgery was discussed, as well. Surgery would take the form of L3-L5 PLIF. The details and risks of surgery were thoroughly reviewed. The potential risks of surgery discussed included the risk of bleeding, infection, injury to a nerve resulting in weakness or paralysis, injury to the spinal cord resulting in paralysis, no change or worsening of symptoms, recurrence of symptoms requiring further surgical intervention including fusion, bowel or bladder dysfunction, sexual dysfunction, spinal leak, risk of anesthesia and/or death. All the above information was discussed at length as well as any remaining questions answered. The patient clearly understands there is no mandate undergo surgery. The patient also fully understands surgery would not completely resolve their symptoms.  Given that the patient has had clinical symptoms for at least 4 years, he does understand that the benefits of surgery at this point may be somewhat limited and thus I cannot guarantee that all of his symptoms were markedly improved.  Lastly, I took this  opportunity to answer any remaining questions the patient may have had regarding this matter.  After reviewing all the above information as stated, the patient indicated he would like time to consider the matter at home.  He was provided with the number to contact my office should he wish to proceed with any further conservative measures, surgical intervention is recommended, or reconsultation office for further discussion.  I did also provide him with a link to a website hosting a video describing his condition of spinal stenosis as well as discussing the details and risk of surgery.  I instructed he and his wife to watch the video and both indicated they would do so.  Again, from a surgeon's perspective, the patient does have a clinical presentation as well as radiographic findings quite consistent with their level of discomfort and I do feel surgical intervention would be quite beneficial to them at this point time.  I have strongly recommended surgical intervention at this point time given the severity of the stenosis and chronicity of his symptoms.  After reviewing this information, the patient indicated he will consider the matter as outlined above    Followup: No follow-ups on file.    Prescriptions Ordered:  No orders of the defined types were placed in this encounter.       Orders Placed:  No orders of the defined types were placed in this encounter.      Electronically signed by Redell Bellingham, MD on 05/10/2024 at 1:44 PM    Please note that this chart was generated using voice recognition Dragon dictation software.  Although every effort was made to ensure the accuracy of this automated transcription, some errors in transcription may have occurred.

## 2024-05-19 ENCOUNTER — Telehealth

## 2024-05-19 NOTE — Telephone Encounter (Signed)
 XR lumbar flex/ext order entered into epic

## 2024-05-19 NOTE — Telephone Encounter (Signed)
 Dr Reinhold would like patient to have lumbar flex/ext prior to surgery that is scheduled for 06/29/24. Can you please add the order for [facility].

## 2024-05-20 NOTE — Telephone Encounter (Signed)
 Called patient to inform him of his 06/08/24 PAT appointment. I also let him know he can have his XR on the same day. He agreed with date and time.

## 2024-05-24 ENCOUNTER — Encounter
Payer: Medicare (Managed Care) | Attending: Neurological Surgery | Primary: Student in an Organized Health Care Education/Training Program

## 2024-05-31 LAB — BUN & CREATININE
BUN: 26 mg/dL
Creatinine: 1.23 mg/dL

## 2024-06-03 NOTE — Telephone Encounter (Signed)
 I called and spoke to patient and moved his surgery from 06/29/24 to 07/08/24 due to Dr Reinhold changing his trauma call schedule. Patient agreed with new date.

## 2024-06-08 ENCOUNTER — Inpatient Hospital Stay
Admit: 2024-06-08 | Payer: Medicare (Managed Care) | Primary: Student in an Organized Health Care Education/Training Program

## 2024-06-08 ENCOUNTER — Ambulatory Visit: Payer: Medicare (Managed Care) | Primary: Student in an Organized Health Care Education/Training Program

## 2024-06-08 LAB — TYPE AND SCREEN
ABO/Rh: O NEG
Antibody Screen: NEGATIVE

## 2024-06-08 NOTE — Patient Instructions (Signed)
 On the Day of Your Surgery, Thursday, 07/08/24, Please Arrive At 5:45 AM     Enter [facility] @ [address] [place], [place] through the [employer], take the lobby elevators to the second floor and check in at the Surgery Registration desk.     Continue to take your home medications as you normally do up to and including the night before surgery with the exception of blood thinning medications.    Blood Thinning Medications:  Please stop prescription blood thinning medications such as Apixaban (Eliquis); Clopidogrel (Plavix); Dabigatran (Pradaxa); Prasugrel (Effient); Rivaroxaban (Xarelto); Ticagrelor (Brilinta); Warfarin (Coumadin) only as directed by your surgeon and/or the prescribing physician    Some common examples of other medications that can thin your blood are: Aspirin, Ibuprofen (Advil, Motrin), Naproxen (Aleve), Meloxicam (Mobic), Celecoxib (Celebrex), Fish Oil, many Herbal Supplements.  These medications should usually be stopped at least 7 days prior to surgery.    Stop Multivitamin and Ibuprofen seven days before surgery    Stop using OTC pain relievers containing Aspirin, Ibuprofen (Advil, Motrin) or Naproxen (Aleve) seven days prior to surgery.  It is OK to continue using Tylenol for pain the week prior to surgery.    Failure to stop certain medications may interfere with your scheduled surgery.    If you receive instructions from your surgeon regarding what medications to stop prior to surgery, please follow those specific instructions.      Please take the following medication(s) the day of surgery with small sips of water:              Levothyroxine      Showering Before Surgery:     You can play an important role in your own health by carefully washing before surgery. Shower the night before and the morning of surgery using the instructions below. If you are allergic to Chlorhexidine Gluconate (CHG) use antibacterial soap instead.    If you were given Chlorhexidine soap, please follow  the instructions included with the soap to shower the night before and the morning of surgery.  If you were not given Chlorhexidine, please shower or bathe the morning of surgery using an antibacterial soap.  Please dress in clean clothing after showering.     General information about Chlorhexidine Gluconate (CHG)   * It should not be used on hair, face, ears, genital area or skin that is not intact.   * It should not be used if breast feeding.   * It should not be used if you allergic to CHG.   * See the bottle for additional information.    Do Not apply any powder, deodorant, lotion/cream/oil, perfume/aftershave, cosmetics, or alcohol-based skin or hair products after showering.    Do Not shave near the planned surgical site unless specifically instructed to.     1. Wash your hair using your normal shampoo and rinse.   2. Wash your face and genital area (privates) using your own shampoo and rinse.   3. Turn off the shower water and pour half of the bottle of CHG onto a clean washcloth.   4. Wash your body from neck down to toes. Pay special attention to your surgical site.   5. Leave the CHG on your skin for 5 minutes and rinse it off.   6. Pat dry with a clean towel, sleep in clean clothes and clean sheets.   7. Do not shave near the surgical site.   8. Repeat process the morning of surgery.  CHG may cause dry skin, but should not cause redness, rash, or intense itching. If an suspect an allergic reaction, use your own soap and shampoo for the morning of surgery and report reaction to the pre-operative nurse.      Additional Instructions:      1. If you are having any type of anesthesia, you are to have NOTHING to eat or drink after midnight the night before surgery.  This includes no gum, hard candy, mints or water.  The only exception to this is small sips of water to take the medications listed above.  No smoking or chewing tobacco after midnight.  No alcoholic beverages for 24 hours prior to surgery.    2.  You may brush your teeth but do not swallow the water.    3. If you wear glasses bring a case for them if you have one.  No contacts should be worn the day of surgery.  You may also bring your hearing aids. If you have dentures, most surgical procedures involving anesthesia will require that you remove them prior to surgery.    4. If you sleep with a CPAP or BiPAP machine at home and plan on staying in the hospital overnight after surgery, please bring your machine with you.     5. Do not wear any jewelry or body piercings the day of surgery.  No nail polish on the operative extremity (arm/hand or leg/foot surgeries)     6. If you are staying overnight with us , you may bring a small bag of necessary personal items.     7. Please wear loose, comfortable clothing.  If you are potentially going to have a cast, sling, brace or bulky dressing, make sure to wear clothing that will fit over it.     8. In case of illness - If you have cold or flu like symptoms (high fever, runny nose, sore throat, cough, etc.) rash, nausea, vomiting, loose stools, and/or recent contact with someone who has a contagious disease (chicken pox, measles, COVID-19, etc.).  Please call your surgeon before coming to the hospital.    Transportation After Your Surgery/Procedure:     If you are going home the same day of surgery you need someone to drive you home.  Your driver must be at least 70 years of age.  A taxi cab or other nonmedical public transportation is not acceptable unless you have someone to ride home in the vehicle with you.   For your safety, someone must remain with you for the first 24 hours after your surgery if you receive anesthesia or medication.  If you do not have someone to stay with you, your procedure may be cancelled.  As a patient at [facility] you can expect quality medical and nursing care that is centered on you individual needs.  Our goal is to make your surgical experience as comfortable as  possible.    Any questions about preparing for your surgery please call [phone].

## 2024-06-08 NOTE — Telephone Encounter (Signed)
 06/08/2024 -  patient stopped by in person to see if his ortho xrays have gotten here. Dr. Reinhold wanted to look at them prior to his surgery 07/08/24     Spoke with Brad. July images from ortho are in the fax system. Patient will not need to get new xrays.

## 2024-06-09 LAB — EKG 12-LEAD
Atrial Rate: 71 {beats}/min
P Axis: 35 degrees
P-R Interval: 162 ms
Q-T Interval: 378 ms
QRS Duration: 78 ms
QTc Calculation (Bazett): 410 ms
R Axis: 21 degrees
T Axis: 46 degrees
Ventricular Rate: 71 {beats}/min

## 2024-06-21 ENCOUNTER — Encounter

## 2024-06-21 NOTE — Telephone Encounter (Signed)
 Patient called to schedule his yearly follow up appointment.  Patient needs new PSA order (with expected date of 06/28/24).  Patient will get PSA done next week at [facility].     Send back to me and I will fax order.

## 2024-06-21 NOTE — Telephone Encounter (Signed)
 Mailed order and faxed to [facility] for patient to complete.

## 2024-06-30 LAB — PSA, DIAGNOSTIC: PSA: 0.01 ng/mL

## 2024-07-06 NOTE — Progress Notes (Addendum)
 "Subjective:      Patient ID: Jake Morales is a 68 y.o. male.    Chief Complaint   Patient presents with    Sore Throat    Nasal Congestion      Patient presents to urgent care for concerns of a Sore throat.  Patient reports that symptoms started last night.  Patient is scheduled to have a lumbar fusion on Thursday at [facility].  Patient has been taking NyQuil and DayQuil for symptom management.  Patient is requesting testing for strep, influenza, COVID and RSV prior to surgery on Thursday.      Sore Throat   This is a new problem. The current episode started yesterday. The problem has been unchanged. There has been no fever. The pain is at a severity of 1/10. The pain is mild. Associated symptoms include a plugged ear sensation (left ear). Pertinent negatives include no abdominal pain, congestion, coughing, headaches, shortness of breath, stridor or vomiting. He has had no exposure to strep or mono. He has tried acetaminophen  and gargles for the symptoms. The treatment provided mild relief.       The following portions of the patient's history were reviewed and updated as appropriate: allergies, current medications, past family history, past medical history, past social history, past surgical history and problem list.    Review of Systems   Constitutional:  Negative for activity change, appetite change and fever.   HENT:  Positive for rhinorrhea and sore throat. Negative for congestion.    Respiratory:  Negative for cough, shortness of breath and stridor.    Gastrointestinal:  Negative for abdominal pain, nausea and vomiting.   Musculoskeletal:  Positive for back pain.   Skin:  Negative for color change and pallor.   Neurological:  Negative for dizziness, light-headedness and headaches.   Psychiatric/Behavioral:  Negative for agitation, behavioral problems and confusion.            Past Surgical History:   Procedure Laterality Date    APPENDECTOMY  1980    CHOLECYSTECTOMY      laparoscopic     COLONOSCOPY N/A 05/07/2016    Performed by Nile KATHEE Pacific, MD at [facility] ENDOSCOPY    COLONOSCOPY POLYPECTOMY N/A 08/09/2021    Performed by Nile KATHEE Pacific, MD at [facility] ENDOSCOPY    COLOSTOMY  10/01/1999    partial colectomy; perforated diverticulitis    COLOSTOMY CLOSURE  2002    CYST REMOVAL      EXPLORATORY LAPAROTOMY  2001    LOA for SBO    FLEXIBLE SIGMOIDOSCOPY N/A 03/17/2024    Performed by Maude JULIANNA Sage, MD at [facility] AMBULATORY SURGERY    FOOT SURGERY Bilateral     nerve    HERNIA REPAIR  2003    inguinal    HERNIA REPAIR  1992    inguinal    NASAL SEPTUM SURGERY      1995  2010    PROSTATECTOMY  2001    STOMACH SURGERY  09/30/1978    ulcer; s/p bilroth?     TRANSURETHRAL RESECTION OF PROSTATE         Social History     Tobacco Use    Smoking status: Former     Types: Cigarettes    Smokeless tobacco: Never    Tobacco comments:     Tried smoking in 1976 that's all folks!   Vaping Use    Vaping status: Never Used   Substance Use Topics    Alcohol use:  Yes     Alcohol/week: 0.0 standard drinks of alcohol     Comment: occassional    Drug use: No       Family History   Problem Relation Age of Onset    Cancer Mother     Macular degeneration Mother     Diabetes Mother     Cataracts Mother     Breast cancer Mother     Heart disease Father     Cancer Father     Prostate cancer Father     Glaucoma Father     Hypertension Father     Cancer Brother     Blindness Neg Hx     Colon cancer Neg Hx        Allergies   Allergen Reactions    Mushroom Flavor Anaphylaxis    Potassium Citrate  Diarrhea          Current Outpatient Medications on File Prior to Visit   Medication Sig Dispense Refill    acidophilus-pectin, citrus 25 million cell -100 mg tablet Take 1 tablet by mouth daily with breakfast.      levothyroxine  (SYNTHROID , LEVOTHROID) 50 MCG tablet Take 1 tablet (50 mcg total) by mouth in the morning.      alum-mag hydroxide-simeth (MAALOX) 200-200-20 mg/5 mL suspension Take 30 mL  by mouth 4 (four) times daily after meals and at bedtime as needed (dyspepsia) Indications: indigestion. (Patient not taking: Reported on 05/05/2024) 354 mL 0    aspirin 81 mg Take 1 tablet (81 mg total) by mouth as needed for pain. (Patient not taking: Reported on 05/05/2024)      polyethylene glycol (GLYCOLAX ) 17 gram packet Take 17 g by mouth in the morning. (Patient not taking: Reported on 07/06/2024)       No current facility-administered medications on file prior to visit.       Objective:    Vitals:    07/06/24 1857 07/06/24 1913   BP: (!) 153/93 133/81   Pulse: 78    Resp: 16    Temp: 36.1 C (97 F)    TempSrc: Temporal    SpO2: 96%    Weight: 79.8 kg (176 lb)    Height: 170.2 cm (5' 7)        No LMP for male patient.       Body mass index is 27.57 kg/m.    Facility age limit for growth %iles is 20 years.      Physical Exam  Vitals reviewed.   Constitutional:       General: He is not in acute distress.     Appearance: Normal appearance. He is not ill-appearing.   HENT:      Head: Normocephalic.      Right Ear: Tympanic membrane, ear canal and external ear normal.      Left Ear: Tympanic membrane, ear canal and external ear normal.      Nose: Congestion and rhinorrhea present.      Mouth/Throat:      Mouth: Mucous membranes are moist.      Pharynx: Posterior oropharyngeal erythema present. No oropharyngeal exudate.   Eyes:      General:         Right eye: No discharge.         Left eye: No discharge.      Extraocular Movements: Extraocular movements intact.      Conjunctiva/sclera: Conjunctivae normal.   Cardiovascular:      Rate and Rhythm: Normal rate and regular  rhythm.      Pulses: Normal pulses.      Heart sounds: Normal heart sounds.   Pulmonary:      Effort: Pulmonary effort is normal.      Breath sounds: Normal breath sounds. No stridor. No wheezing or rhonchi.   Abdominal:      General: Bowel sounds are normal. There is no distension.      Palpations: Abdomen is soft.      Tenderness: There is no  abdominal tenderness.   Musculoskeletal:         General: Normal range of motion.      Cervical back: Normal range of motion. No rigidity or tenderness.   Lymphadenopathy:      Cervical: No cervical adenopathy.   Skin:     General: Skin is warm and dry.   Neurological:      General: No focal deficit present.      Mental Status: He is alert and oriented to person, place, and time.   Psychiatric:         Mood and Affect: Mood normal.         Behavior: Behavior normal.         Thought Content: Thought content normal.         Judgment: Judgment normal.            Assessment/Plan:  The patient reports sore throat and is requesting POC testing for strep, influenza, COVID and RSV due to having a lumbar fusion on Thursday.  Strep is negative.  Influenza, COVID and RSV are all negative.  Patient was notified of negative results.  May continue with Mucinex, and antihistamine as long as it is not contraindicated preop.  Follow up with PCP or return to urgent care for further evaluation if no improvement or worsening of symptoms.  Patient is receptive and verbalized understanding  Labs for this visit:    Office Visit on 07/06/2024   Component Date Value    External Strep A Cepheid 07/06/2024 Not Detected     External Poct Influenza * 07/06/2024 Negative     External Poct Influenza * 07/06/2024 Negative     External POCT SARS COV 2* 07/06/2024 Presumptive Negative         Parry was seen today for sore throat and nasal congestion.    Diagnoses and all orders for this visit:    Sore throat  -     POCT Xpert, Xpress Strep A (Cepheid)  -     POCT Influenza A/Influenza B/SARS-COV-2 Veritor    Viral upper respiratory infection          No orders of the defined types were placed in this encounter.      There are no Patient Instructions on file for this visit.    This note is dictated with the use of M*Modal.Please note that this dictation was completed with computer voice recognition software.  Quite often unanticipated grammatical,  syntax, homophones, and other interpretive errors are inadvertently transcribed by the computer software.  Please disregard these errors.  Please excuse any errors that have escaped final proofreading.      I personally discussed test results with patient/parent.    Education handout and discharge papers given. Paperwork explained. Denies questions or concerns.     Discussed that follow up care is usually required after a visit to the Urgent care. It is your responsibility to contact your primary care provider for follow up.    If symptoms are  not improving, worsening, or concerning symptoms of illness develop, follow up with your primary care provider or go to the nearest Emergency Department for further care immediately.       Ronal Caldron Sintobin, APRN-CNP  07/06/24 1945       Ronal Caldron Sintobin, APRN-CNP  07/06/24 1947    "

## 2024-07-07 ENCOUNTER — Ambulatory Visit
Admit: 2024-07-07 | Discharge: 2024-07-07 | Payer: Medicare (Managed Care) | Attending: Specialist | Primary: Student in an Organized Health Care Education/Training Program

## 2024-07-07 NOTE — Progress Notes (Signed)
 "  Cordella BIRCH. Evander Macaraeg, MD [facility]    [facility] Urology Office Progress Note    Patient:  Jake Morales  Date of Birth: 25-Mar-1956  Date: 07/07/2024    HISTORY OF PRESENT ILLNESS:   The patient is a 68 y.o. male  No evidence of prostate cancer recurrence s/p 01/13/01 open radical prostatectomy since PSA is <0.01.  Will repeat a PSA in 12 months.   No flank pain or gross hematuria to suggest recurrent kidney stones.   Patient's 01/07/24 CT shows a small non-obstructing Left kidney stone but his KUB was negative.  Patient has not used his Rx for Tadalafil  (Cialis ) 20 mg prn ED recently and does not need any refills.  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6 mL/sec  Average Flow Rate: 4.1 mL/sec  Interpretation: weak flow    Postvoid Residual:  Post-void Residual by bladder scanner: 7 mL     Urinalysis today:  No results found for this visit on 07/07/24.    Last several PSA's:  Lab Results   Component Value Date    PSA 0.01 06/30/2024    PSA 0.01 07/18/2023    PSA 0.06 05/22/2022       Last total testosterone:  No results found for: TESTOSTERONE    Last BUN and  creatinine:  Lab Results   Component Value Date    BUN 26 05/31/2024     Lab Results   Component Value Date    CREATININE 1.23 05/31/2024       Last CBC:  Lab Results   Component Value Date    WBC 5.4 12/03/2016    HGB 12.2 (L) 12/03/2016    HCT 37.2 (L) 12/03/2016    MCV 85.1 12/03/2016    PLT 223 12/03/2016       Additional Lab/Culture results: none    Imaging Reviewed during this Office Visit:   X-ray abdomen 01/07/24  IMPRESSION:     Nonspecific colonic distention with a moderate amount of stool and gas present.     CT abdomen and pelvis with contrast 01/07/24  Impression:     *  Marked distention of the colon from the cecum to the rectosigmoid colon where a transition point is seen. Gas and stool are seen distal to this. This consistent with a partial distal colonic obstruction. Neoplasm cannot be excluded.     *  Distended fluid-filled loops of small bowel throughout the abdomen with no focal transition point seen     *  Nonobstructing left renal calculus     (results were independently  reviewed by physician and radiology report verified)    Physical Exam:    There were no vitals filed for this visit.  Body mass index is 27.72 kg/m.  Patient is a 68 y.o. male in no acute distress and alert and oriented to person, place and time.    Assessment and Plan   Assessment   1. Renal calculus, left    2. Hypocitraturia    3. Erectile dysfunction after radical prostatectomy    4. Personal history of malignant neoplasm of prostate    5. Nocturia            Plan    Return in about 1 year (around 07/07/2025) for PSA.  No evidence of prostate cancer recurrence s/p 01/13/01 open radical prostatectomy since PSA is <0.01.  Will repeat a PSA in 12 months.   No flank pain or gross hematuria to suggest recurrent kidney stones.   Patient's 01/07/24 CT shows a small non-obstructing Left kidney stone but his KUB was negative.  Patient has not used his Rx for Tadalafil  (Cialis ) 20 mg prn ED recently and does not need any refills.          Cordella CORDOBA Cooper Moroney, MD FACS  07/07/2024 1:19 PM    "

## 2024-07-08 ENCOUNTER — Observation Stay
Admission: RE | Admit: 2024-07-08 | Discharge: 2024-07-09 | Disposition: A | Discharge status: Home / Self Care | Payer: Medicare (Managed Care) | Source: Other Acute Inpatient Hospital | Attending: Neurological Surgery | Admitting: Neurological Surgery

## 2024-07-08 ENCOUNTER — Ambulatory Visit
Admit: 2024-07-08 | Payer: Medicare (Managed Care) | Primary: Student in an Organized Health Care Education/Training Program

## 2024-07-08 DIAGNOSIS — M48062 Spinal stenosis, lumbar region with neurogenic claudication: Secondary | ICD-10-CM

## 2024-07-08 DIAGNOSIS — M4316 Spondylolisthesis, lumbar region: Principal | ICD-10-CM

## 2024-07-08 MED ORDER — SODIUM CHLORIDE (PF) 0.9 % IJ SOLN
0.9 | INTRAMUSCULAR | Status: AC
Start: 2024-07-08 — End: 2024-07-08

## 2024-07-08 MED ORDER — NORMAL SALINE FLUSH 0.9 % IV SOLN
0.9 | Freq: Two times a day (BID) | INTRAVENOUS | Status: DC
Start: 2024-07-08 — End: 2024-07-08

## 2024-07-08 MED ORDER — PROPOFOL 200 MG/20ML IV EMUL
200 | Freq: Once | INTRAVENOUS | Status: DC | PRN
Start: 2024-07-08 — End: 2024-07-08
  Administered 2024-07-08: 12:00:00 150 via INTRAVENOUS

## 2024-07-08 MED ORDER — LIDOCAINE-EPINEPHRINE 1 %-1:100000 IJ SOLN
1 | INTRAMUSCULAR | Status: DC | PRN
Start: 2024-07-08 — End: 2024-07-08
  Administered 2024-07-08: 12:00:00 10 via INTRADERMAL

## 2024-07-08 MED ORDER — OXYCODONE-ACETAMINOPHEN 5-325 MG PO TABS
5-325 | ORAL | Status: DC | PRN
Start: 2024-07-08 — End: 2024-07-09
  Administered 2024-07-09 (×3): 2 via ORAL

## 2024-07-08 MED ORDER — ONDANSETRON HCL 4 MG/2ML IJ SOLN
4 | Freq: Once | INTRAMUSCULAR | Status: DC | PRN
Start: 2024-07-08 — End: 2024-07-08
  Administered 2024-07-08: 16:00:00 4 via INTRAVENOUS

## 2024-07-08 MED ORDER — MORPHINE SULFATE (PF) 4 MG/ML IJ SOLN
4 | INTRAMUSCULAR | Status: DC | PRN
Start: 2024-07-08 — End: 2024-07-09
  Administered 2024-07-08 – 2024-07-09 (×2): 4 mg via INTRAVENOUS

## 2024-07-08 MED ORDER — THROMBIN 5000 UNITS EX SOLR
5000 | CUTANEOUS | Status: DC | PRN
Start: 2024-07-08 — End: 2024-07-08
  Administered 2024-07-08: 13:00:00 5000 via TOPICAL
  Administered 2024-07-08: 13:00:00 15000 via TOPICAL
  Administered 2024-07-08: 15:00:00 5000 via TOPICAL

## 2024-07-08 MED ORDER — NORMAL SALINE FLUSH 0.9 % IV SOLN
0.9 | INTRAVENOUS | Status: DC | PRN
Start: 2024-07-08 — End: 2024-07-08

## 2024-07-08 MED ORDER — HYDROMORPHONE HCL PF 1 MG/ML IJ SOLN
1 | INTRAMUSCULAR | Status: DC | PRN
Start: 2024-07-08 — End: 2024-07-08
  Administered 2024-07-08 (×2): 0.5 mg via INTRAVENOUS

## 2024-07-08 MED ORDER — OXYCODONE-ACETAMINOPHEN 5-325 MG PO TABS
5-325 | ORAL | Status: DC | PRN
Start: 2024-07-08 — End: 2024-07-09

## 2024-07-08 MED ORDER — TIZANIDINE HCL 2 MG PO TABS
2 | Freq: Three times a day (TID) | ORAL | Status: DC | PRN
Start: 2024-07-08 — End: 2024-07-09

## 2024-07-08 MED ORDER — NORMAL SALINE FLUSH 0.9 % IV SOLN
0.9 | Freq: Two times a day (BID) | INTRAVENOUS | Status: DC
Start: 2024-07-08 — End: 2024-07-09

## 2024-07-08 MED ORDER — DIPHENHYDRAMINE HCL 50 MG/ML IJ SOLN
50 | Freq: Once | INTRAMUSCULAR | Status: DC | PRN
Start: 2024-07-08 — End: 2024-07-08

## 2024-07-08 MED ORDER — DEXAMETHASONE SOD PHOSPHATE PF 10 MG/ML IJ SOLN
10 | Freq: Once | INTRAMUSCULAR | Status: DC | PRN
Start: 2024-07-08 — End: 2024-07-08
  Administered 2024-07-08: 12:00:00 10 via INTRAVENOUS

## 2024-07-08 MED ORDER — PROCHLORPERAZINE EDISYLATE 10 MG/2ML IJ SOLN
10 | Freq: Once | INTRAMUSCULAR | Status: AC
Start: 2024-07-08 — End: 2024-07-08
  Administered 2024-07-08: 19:00:00 5 mg via INTRAVENOUS

## 2024-07-08 MED ORDER — SUGAMMADEX SODIUM 200 MG/2ML IV SOLN
200 | INTRAVENOUS | Status: AC
Start: 2024-07-08 — End: 2024-07-08

## 2024-07-08 MED ORDER — MORPHINE SULFATE (PF) 4 MG/ML IJ SOLN
4 | INTRAMUSCULAR | Status: DC | PRN
Start: 2024-07-08 — End: 2024-07-09

## 2024-07-08 MED ORDER — TIZANIDINE HCL 2 MG PO TABS
2 | ORAL_TABLET | Freq: Three times a day (TID) | ORAL | 0 refills | 30.00000 days | Status: DC | PRN
Start: 2024-07-08 — End: 2024-07-21

## 2024-07-08 MED ORDER — ONDANSETRON HCL 4 MG/2ML IJ SOLN
4 | INTRAMUSCULAR | Status: AC
Start: 2024-07-08 — End: 2024-07-08

## 2024-07-08 MED ORDER — SODIUM CHLORIDE 0.9 % IV SOLN
0.9 | INTRAVENOUS | Status: DC | PRN
Start: 2024-07-08 — End: 2024-07-09

## 2024-07-08 MED ORDER — MORPHINE SULFATE (PF) 2 MG/ML IV SOLN
2 | INTRAVENOUS | Status: DC | PRN
Start: 2024-07-08 — End: 2024-07-08

## 2024-07-08 MED ORDER — FENTANYL CITRATE (PF) 100 MCG/2ML IJ SOLN
100 | Freq: Once | INTRAMUSCULAR | Status: DC | PRN
Start: 2024-07-08 — End: 2024-07-08
  Administered 2024-07-08 (×2): 50 via INTRAVENOUS

## 2024-07-08 MED ORDER — THROMBIN 5000 UNITS EX SOLR
5000 | CUTANEOUS | Status: AC
Start: 2024-07-08 — End: 2024-07-08

## 2024-07-08 MED ORDER — DEXAMETHASONE SOD PHOSPHATE PF 10 MG/ML IJ SOLN
10 | INTRAMUSCULAR | Status: AC
Start: 2024-07-08 — End: 2024-07-08

## 2024-07-08 MED ORDER — OXYCODONE HCL 5 MG PO TABS
5 | Freq: Once | ORAL | Status: DC | PRN
Start: 2024-07-08 — End: 2024-07-08

## 2024-07-08 MED ORDER — ROCURONIUM BROMIDE 50 MG/5ML IV SOLN
50 | INTRAVENOUS | Status: AC
Start: 2024-07-08 — End: 2024-07-08

## 2024-07-08 MED ORDER — METOCLOPRAMIDE HCL 5 MG/ML IJ SOLN
5 | Freq: Once | INTRAMUSCULAR | Status: DC | PRN
Start: 2024-07-08 — End: 2024-07-08

## 2024-07-08 MED ORDER — CEFAZOLIN 2000 MG IN 20 ML SWFI IV SYRINGE (PREMIX)
Freq: Three times a day (TID) | Status: AC
Start: 2024-07-08 — End: 2024-07-08
  Administered 2024-07-08 – 2024-07-09 (×2): 2000 mg via INTRAVENOUS

## 2024-07-08 MED ORDER — LIDOCAINE HCL (PF) 1 % IJ SOLN
1 | Freq: Once | INTRAMUSCULAR | Status: DC | PRN
Start: 2024-07-08 — End: 2024-07-08

## 2024-07-08 MED ORDER — LIDOCAINE HCL (PF) 2 % IJ SOLN
2 | INTRAMUSCULAR | Status: AC
Start: 2024-07-08 — End: 2024-07-08

## 2024-07-08 MED ORDER — SURGIFLO HEMOSTATIC MATRIX 3 ML
CUTANEOUS | Status: DC | PRN
Start: 2024-07-08 — End: 2024-07-08
  Administered 2024-07-08: 13:00:00 1 via TOPICAL

## 2024-07-08 MED ORDER — SODIUM CHLORIDE 0.9 % IV SOLN
0.9 | INTRAVENOUS | Status: DC | PRN
Start: 2024-07-08 — End: 2024-07-08

## 2024-07-08 MED ORDER — FENTANYL CITRATE (PF) 100 MCG/2ML IJ SOLN
100 | INTRAMUSCULAR | Status: DC | PRN
Start: 2024-07-08 — End: 2024-07-08

## 2024-07-08 MED ORDER — ACETAMINOPHEN 325 MG PO TABS
325 | ORAL | Status: DC | PRN
Start: 2024-07-08 — End: 2024-07-09

## 2024-07-08 MED ORDER — ONDANSETRON 4 MG PO TBDP
4 | Freq: Three times a day (TID) | ORAL | Status: DC | PRN
Start: 2024-07-08 — End: 2024-07-09

## 2024-07-08 MED ORDER — PHENYLEPHRINE HCL 1 MG/10ML IV SOSY
1 | Freq: Once | INTRAVENOUS | Status: DC | PRN
Start: 2024-07-08 — End: 2024-07-08
  Administered 2024-07-08 (×3): 100 via INTRAVENOUS

## 2024-07-08 MED ORDER — FENTANYL CITRATE (PF) 100 MCG/2ML IJ SOLN
100 | INTRAMUSCULAR | Status: AC
Start: 2024-07-08 — End: 2024-07-08

## 2024-07-08 MED ORDER — ONDANSETRON HCL 4 MG/2ML IJ SOLN
4 | Freq: Four times a day (QID) | INTRAMUSCULAR | Status: DC | PRN
Start: 2024-07-08 — End: 2024-07-09
  Administered 2024-07-08 – 2024-07-09 (×3): 4 mg via INTRAVENOUS

## 2024-07-08 MED ORDER — PROCHLORPERAZINE EDISYLATE 10 MG/2ML IJ SOLN
10 | Freq: Once | INTRAMUSCULAR | Status: DC | PRN
Start: 2024-07-08 — End: 2024-07-08

## 2024-07-08 MED ORDER — SODIUM CHLORIDE 0.9 % IR SOLN
0.9 | Status: DC | PRN
Start: 2024-07-08 — End: 2024-07-08
  Administered 2024-07-08 (×2): 1000

## 2024-07-08 MED ORDER — SUGAMMADEX SODIUM 200 MG/2ML IV SOLN
200 | Freq: Once | INTRAVENOUS | Status: DC | PRN
Start: 2024-07-08 — End: 2024-07-08
  Administered 2024-07-08: 16:00:00 200 via INTRAVENOUS

## 2024-07-08 MED ORDER — LEVOTHYROXINE SODIUM 50 MCG PO TABS
50 | Freq: Every day | ORAL | Status: DC
Start: 2024-07-08 — End: 2024-07-09
  Administered 2024-07-09: 09:00:00 50 ug via ORAL

## 2024-07-08 MED ORDER — POLYMYXIN B SULFATE 500000 UNITS IJ SOLR
500000 | INTRAMUSCULAR | Status: AC
Start: 2024-07-08 — End: 2024-07-08

## 2024-07-08 MED ORDER — SODIUM CHLORIDE 0.9 % IV SOLN
0.9 | INTRAVENOUS | Status: DC
Start: 2024-07-08 — End: 2024-07-08

## 2024-07-08 MED ORDER — ROCURONIUM BROMIDE 50 MG/5ML IV SOLN
50 | Freq: Once | INTRAVENOUS | Status: DC | PRN
Start: 2024-07-08 — End: 2024-07-08
  Administered 2024-07-08: 12:00:00 50 via INTRAVENOUS
  Administered 2024-07-08: 13:00:00 25 via INTRAVENOUS

## 2024-07-08 MED ORDER — MEPERIDINE HCL 25 MG/ML IJ SOLN
25 | INTRAMUSCULAR | Status: DC | PRN
Start: 2024-07-08 — End: 2024-07-08

## 2024-07-08 MED ORDER — SODIUM CHLORIDE 0.9 % IR SOLN
0.9 | Status: AC | PRN
Start: 2024-07-08 — End: 2024-07-08
  Administered 2024-07-08: 16:00:00 100

## 2024-07-08 MED ORDER — LIDOCAINE-EPINEPHRINE 1 %-1:100000 IJ SOLN
1 | INTRAMUSCULAR | Status: AC
Start: 2024-07-08 — End: 2024-07-08

## 2024-07-08 MED ORDER — PHENYLEPHRINE HCL 1 MG/10ML IV SOSY
1 | INTRAVENOUS | Status: AC
Start: 2024-07-08 — End: 2024-07-08

## 2024-07-08 MED ORDER — SODIUM CHLORIDE 0.9 % IV SOLN
0.9 | INTRAVENOUS | Status: DC
Start: 2024-07-08 — End: 2024-07-09
  Administered 2024-07-08: 17:00:00 via INTRAVENOUS

## 2024-07-08 MED ORDER — NORMAL SALINE FLUSH 0.9 % IV SOLN
0.9 | INTRAVENOUS | Status: DC | PRN
Start: 2024-07-08 — End: 2024-07-09

## 2024-07-08 MED ORDER — SODIUM CHLORIDE 0.9 % IV SOLN (ADDEASE)
0.9 | Freq: Three times a day (TID) | INTRAVENOUS | Status: DC
Start: 2024-07-08 — End: 2024-07-08

## 2024-07-08 MED ORDER — LACTATED RINGERS IV SOLN
INTRAVENOUS | Status: DC
Start: 2024-07-08 — End: 2024-07-08
  Administered 2024-07-08 (×2): via INTRAVENOUS

## 2024-07-08 MED ORDER — CEFAZOLIN 2000 MG IN NS 50 ML IVPB
Freq: Three times a day (TID) | Status: DC
Start: 2024-07-08 — End: 2024-07-08

## 2024-07-08 MED ORDER — PROPOFOL 200 MG/20ML IV EMUL
200 | INTRAVENOUS | Status: AC
Start: 2024-07-08 — End: 2024-07-08

## 2024-07-08 MED ORDER — MORPHINE SULFATE (PF) 4 MG/ML IV SOLN
4 | INTRAVENOUS | Status: DC | PRN
Start: 2024-07-08 — End: 2024-07-08

## 2024-07-08 MED ORDER — OXYCODONE-ACETAMINOPHEN 5-325 MG PO TABS
5-325 | ORAL_TABLET | ORAL | 0 refills | Status: AC | PRN
Start: 2024-07-08 — End: 2024-07-15

## 2024-07-08 MED ORDER — LIDOCAINE HCL (PF) 2 % IJ SOLN
2 | Freq: Once | INTRAMUSCULAR | Status: DC | PRN
Start: 2024-07-08 — End: 2024-07-08
  Administered 2024-07-08: 12:00:00 80 via INTRAVENOUS

## 2024-07-08 MED ORDER — CEFAZOLIN 2000 MG IN 20 ML SWFI IV SYRINGE (PREMIX)
Freq: Once | Status: AC
Start: 2024-07-08 — End: 2024-07-08
  Administered 2024-07-08 (×2): 2000 mg via INTRAVENOUS

## 2024-07-08 MED FILL — DIPRIVAN 200 MG/20ML IV EMUL: 200 MG/20ML | INTRAVENOUS | Qty: 20

## 2024-07-08 MED FILL — POLYMYXIN B SULFATE 500000 UNITS IJ SOLR: 500000 [IU] | INTRAMUSCULAR | Qty: 500000

## 2024-07-08 MED FILL — PROCHLORPERAZINE EDISYLATE 10 MG/2ML IJ SOLN: 10 MG/2ML | INTRAMUSCULAR | Qty: 2

## 2024-07-08 MED FILL — ONDANSETRON HCL 4 MG/2ML IJ SOLN: 4 MG/2ML | INTRAMUSCULAR | Qty: 2

## 2024-07-08 MED FILL — SODIUM CHLORIDE (PF) 0.9 % IJ SOLN: 0.9 % | INTRAMUSCULAR | Qty: 10

## 2024-07-08 MED FILL — THROMBIN-JMI 5000 UNITS EX SOLR: 5000 [IU] | CUTANEOUS | Qty: 10000

## 2024-07-08 MED FILL — OXYCODONE-ACETAMINOPHEN 5-325 MG PO TABS: 5-325 mg | ORAL | Qty: 1

## 2024-07-08 MED FILL — CEFAZOLIN 2000 MG IN NS 50 ML IVPB: Qty: 50

## 2024-07-08 MED FILL — PHENYLEPHRINE HCL (PRESSORS) 1 MG/10ML IV SOSY: 1 MG/0ML | INTRAVENOUS | Qty: 10

## 2024-07-08 MED FILL — CEFAZOLIN 2000 MG IN 20 ML SWFI IV SYRINGE (PREMIX): Qty: 2000

## 2024-07-08 MED FILL — ROCURONIUM BROMIDE 50 MG/5ML IV SOLN: 50 MG/5ML | INTRAVENOUS | Qty: 5

## 2024-07-08 MED FILL — HYDROMORPHONE HCL 1 MG/ML IJ SOLN: 1 mg/mL | INTRAMUSCULAR | Qty: 1

## 2024-07-08 MED FILL — BRIDION 200 MG/2ML IV SOLN: 200 MG/2ML | INTRAVENOUS | Qty: 2

## 2024-07-08 MED FILL — FENTANYL CITRATE (PF) 100 MCG/2ML IJ SOLN: 100 MCG/2ML | INTRAMUSCULAR | Qty: 2

## 2024-07-08 MED FILL — MORPHINE SULFATE 4 MG/ML IJ SOLN: 4 mg/mL | INTRAMUSCULAR | Qty: 1

## 2024-07-08 MED FILL — MORPHINE SULFATE (PF) 2 MG/ML IV SOLN: 2 mg/mL | INTRAVENOUS | Qty: 1

## 2024-07-08 MED FILL — THROMBIN-JMI 5000 UNITS EX SOLR: 5000 [IU] | CUTANEOUS | Qty: 20000

## 2024-07-08 MED FILL — DEXAMETHASONE SOD PHOSPHATE PF 10 MG/ML IJ SOLN: 10 mg/mL | INTRAMUSCULAR | Qty: 1

## 2024-07-08 MED FILL — SODIUM CHLORIDE (PF) 0.9 % IJ SOLN: 0.9 % | INTRAMUSCULAR | Qty: 20

## 2024-07-08 MED FILL — POLYMYXIN B SULFATE 500000 UNITS IJ SOLR: 500000 [IU] | INTRAMUSCULAR | Qty: 1000000

## 2024-07-08 MED FILL — XYLOCAINE/EPINEPHRINE 1 %-1:100000 IJ SOLN: 1 %-:00000 | INTRAMUSCULAR | Qty: 20

## 2024-07-08 MED FILL — XYLOCAINE-MPF 2 % IJ SOLN: 2 % | INTRAMUSCULAR | Qty: 5

## 2024-07-08 NOTE — Anesthesia Postprocedure Evaluation (Signed)
"  Department of Anesthesiology  Postprocedure Note    Patient: Jake Morales  MRN: [id number]  Birthdate: 11/10/1955  Date of evaluation: 07/08/2024    Procedure Summary       Date: 07/08/24 Room / Location: [facility] OR 01 / [facility]    Anesthesia Start: [phone] Anesthesia Stop: 1212    Procedure: LUMBAR INTERBODY FUSION POSTERIOR L3-L5, C ARM (Back) Diagnosis:       Spinal stenosis of lumbar region with neurogenic claudication      Spondylolisthesis at L4-L5 level      Lumbar radiculopathy      (Spinal stenosis of lumbar region with neurogenic claudication [M48.062])      (Spondylolisthesis at L4-L5 level [M43.16])      (Lumbar radiculopathy [M54.16])    Surgeons: Reinhold Rogue, MD Responsible Provider: Lang Delinda ORN, DO    Anesthesia Type: general ASA Status: 3            Anesthesia Type: No value filed.    Aldrete Phase I: Aldrete Score: 8    Aldrete Phase II:      Anesthesia Post Evaluation    Patient location during evaluation: PACU  Patient participation: complete - patient participated  Level of consciousness: awake and alert  Airway patency: patent  Nausea & Vomiting: no nausea and no vomiting  Cardiovascular status: hemodynamically stable  Respiratory status: acceptable  Hydration status: euvolemic  Pain management: adequate      No notable events documented.  "

## 2024-07-08 NOTE — Discharge Instructions (Addendum)
"  No driving for two weeks after surgery.   No lifting greater than 10 pounds for the first month.   Keep dressing dry and in place until shower on Monday Oct 13  Remove dressing after shower On October 13      Keep it Clean - Post-Operative Home instructions    These instructions are to help you have the best possible recovery after your surgical procedure. [name] is here to support you. If you have questions, call [phone] Monday through Friday from 7:30AM to 8:30PM to speak to a nurse. If you need to speak to someone outside of these hours, call your physician.     Incision Dos and Donts  Do wash hands before and after dressing changes or when you have had any contact with your incision. Use hand sanitizer or antibacterial soap.  Do keep your incision clean and dry. Its OK to wash the skin around your incision with mild soap and water .  Do change your dressing as you were told.   Do notify your doctor if the dressing becomes wet or dirty.  Do use a clean washcloth every time when cleaning your incision.   Do sleep on clean linens.  Do keep pets away from incision site.  Dont sit in a bathtub, pool, or hot tub until your incision is fully closed and any drains are removed.  Dont scrub, pick, scratch, or pull at your incision.  Dont use oils, lotions, or creams on your incision unless your healthcare provider approves it.    Follow-up  You will have one or more follow-up visits with your healthcare provider. These are needed to check how well youre healing. Your drain, stitches, or staples may also be removed during these visits. Do not miss your follow-up visit, even if you are feeling better.      Call your healthcare provider right away if you have the following:  Fever of 100.53F (38C) or higher, or as advised by your healthcare provider.  Chest pain or trouble breathing.  Pain or tenderness in your leg(s).  Increased pain, redness, swelling, bleeding, or foul-smelling drainage at the incision  site.  Incision changes, separates or is hot to the touch.  Problems with the drain if you have one.  Itchy, swollen skin; skin rash.    Medicines, Diet, and Activity:   Refer to your discharge paperwork for further instructions    "

## 2024-07-08 NOTE — H&P (Signed)
 "History and Physical Service   [facility]    HISTORY AND PHYSICAL EXAMINATION            Date of Evaluation: 07/08/2024  Patient name:  Jake Morales  MRN:   [id number]  Date of Birth:  [birth year]  PCP:    Verdie Dover, MD    History Obtained From:     Patient, medical records    History of Present Illness:     This is Jake Morales a 68 y.o. male who presents today for a LUMBAR INTERBODY FUSION POSTERIOR L3-L5 by Reinhold Rogue, MD for Spinal stenosis of lumbar region with neurogenic claudication; Spondylolis*. The patient's chief complaint is low back pain and intermittent lower extremity discomfort and numbness that has been ongoing for about 4 years (left leg is more bothersome than right). Pain is aggravated with prolonged standing and walking and minimally improved with rest. Previous treatments include epidural steroid injections. Denies hx of diabetes. Last ibuprofen over a week ago.      Per Dr. Francina note dated 05/10/24:  A recent MRI of the lumbar spine performed Feb 25, 2024 is reviewed. This study is most notable for severe focal spinal stenosis at the L3-4 and L4-5 levels with more early moderate central stenosis at the L2-3 level. Additionally the patient does have marked degenerative disc disease throughout most levels of the lumbar spine including associated grade 1 spondylolisthesis both at the L3-4 and L4-5 levels.    Past Medical History:     Past Medical History:   Diagnosis Date    Bowel obstruction (HCC) 12/2017    Bowel obstruction (HCC)     history of 2 episodes that resolved without surgery    Chronic back pain     Diarrhea     related to prior bowel surgeries    Diverticulitis     had colectomy and colostomy    Diverticulosis     Duodenal ulcer     History of kidney stones     X1, passed on his own    Hypercholesteremia     Hypothyroid     Knee pain     Numbness and tingling     intermittant, more in left leg than right    Peptic ulcer      Personal history of prostate cancer     Prostatitis, acute     Rosacea     SOB (shortness of breath) 2024    About 1 year ago (2024) he had some problems with shortness of breath when tying his shoes and also while in [place]  because of the higher altitude.  Had testing done, no particular problems identified (per patient), had last follow up visit with Dr Merrianne about 1 month ago    Thyroid nodule     just watching it, does not affect swallowing        Past Surgical History:     Past Surgical History:   Procedure Laterality Date    APPENDECTOMY      BLADDER SURGERY      [facility] 06-13-2009; cystoscopy    CHOLECYSTECTOMY      COLECTOMY  2001    with colostomy for diverticulitis    COLONOSCOPY      COLOSTOMY  2001    CYST REMOVAL Left     hand    FOOT NEUROMA SURGERY Bilateral     GANGLION CYST EXCISION      from back    INGUINAL  HERNIA REPAIR Bilateral     NASAL SEPTUM SURGERY      X2 surgeries    PAIN MANAGEMENT PROCEDURE      multiple epidural steroid injections, SI joint injection, radiofrequency ablation    PROSTATE BIOPSY      11-28-2000    PROSTATECTOMY      01-13-2001    REVISION COLOSTOMY  2002    SMALL INTESTINE SURGERY  2001    due to blockage caused by adhesions from prior bowel surgery    SMALL INTESTINE SURGERY  1980    surgery for duodenal ulcer        Medications Prior to Admission:     Prior to Admission medications   Medication Sig Start Date End Date Taking? Authorizing Provider   Azelaic Acid 15 % GEL Apply topically See Admin Instructions   Yes [provider]   ibuprofen (ADVIL;MOTRIN) 200 MG tablet Take 1-3 tablets by mouth 2 times daily as needed for Pain   Yes [provider]   levothyroxine  (SYNTHROID ) 50 MCG tablet Take 1 tablet by mouth Daily 03/22/21  Yes [provider]   Polyethylene Glycol 3350  (MIRALAX  PO) Take by mouth in the morning and at bedtime   Yes [provider]   Probiotic Product (PROBIOTIC PO) Take 1 capsule by mouth daily    Yes [provider]   Multiple Vitamins-Minerals (THERAPEUTIC MULTIVITAMIN-MINERALS) tablet Take 1 tablet by mouth daily   Yes [provider]        Allergies:     Citric acid -potassium citrate  [potassium citrate ] and Mushroom    Social History:     Tobacco:    reports that he has never smoked. He has never used smokeless tobacco.  Alcohol:      reports current alcohol use.  Drug Use:  reports no history of drug use.    Family History:     Family History   Family history unknown: Yes       Review of Systems:     Positive and Negative as described in HPI.    Review of Systems   Constitutional:  Negative for chills, fatigue, fever and unexpected weight change.   HENT:  Positive for congestion and postnasal drip. Negative for dental problem, hearing loss, rhinorrhea, sinus pressure, sore throat and trouble swallowing.         Urgent care visit a couple days ago for sore throat and congestion - strep, flu, COVID, RSV negative. Sore throat resolved, some lingering congestion   Eyes:  Negative for visual disturbance.        Wears glasses   Respiratory:  Negative for apnea, cough, chest tightness, shortness of breath and wheezing.    Cardiovascular:  Negative for chest pain, palpitations and leg swelling.   Gastrointestinal:  Positive for diarrhea (Chronic - hx of bowel resection). Negative for abdominal pain, blood in stool, constipation, nausea and vomiting.   Genitourinary:  Negative for difficulty urinating, dysuria and hematuria.   Musculoskeletal:  Positive for back pain. Negative for arthralgias, gait problem, neck pain and neck stiffness.   Skin:  Negative for rash and wound.   Allergic/Immunologic: Negative for immunocompromised state.   Neurological:  Positive for numbness (Intermittent, BLEs). Negative for dizziness, tremors, seizures, syncope, weakness, light-headedness and headaches.   Psychiatric/Behavioral:  Negative for dysphoric mood. The patient is not nervous/anxious.         Physical  Exam:   BP (!) 144/96   Pulse 73   Temp 97.3  F (36.3 C)   Resp 16   Ht 1.702 m (5' 7)   Wt 79.8 kg (176 lb)   SpO2 96%   BMI 27.57 kg/m   No LMP for male patient.  No obstetric history on file.  No results for input(s): POCGLU in the last 72 hours.    Physical Exam  Vitals reviewed.   Constitutional:       General: He is not in acute distress.     Appearance: Normal appearance. He is not ill-appearing.   HENT:      Head: Normocephalic and atraumatic.      Right Ear: External ear normal.      Left Ear: External ear normal.      Nose: No congestion.      Mouth/Throat:      Mouth: Mucous membranes are moist.   Eyes:      Extraocular Movements: Extraocular movements intact.      Conjunctiva/sclera: Conjunctivae normal.      Pupils: Pupils are equal, round, and reactive to light.      Comments: Wearing glasses   Neck:      Vascular: No carotid bruit.   Cardiovascular:      Rate and Rhythm: Normal rate and regular rhythm.      Pulses: Normal pulses.      Heart sounds: Normal heart sounds.   Pulmonary:      Effort: Pulmonary effort is normal.      Breath sounds: Normal breath sounds.   Abdominal:      General: Bowel sounds are normal.      Palpations: Abdomen is soft.   Musculoskeletal:      Cervical back: Normal range of motion and neck supple.      Right lower leg: No edema.      Left lower leg: No edema.   Skin:     General: Skin is warm and dry.   Neurological:      Mental Status: He is alert and oriented to person, place, and time.      Motor: No weakness.   Psychiatric:         Mood and Affect: Mood normal.          Investigations:      Laboratory Testing:  No results found for this or any previous visit (from the past 24 hours).    No results for input(s): HGB, HCT, WBC, MCV, PLATELET, NA, K, CL, CO2, BUN, CREATININE, GLUCOSE, INR, PROTIME, APTT, AST, ALT, LABALBU, HCG in the last 720 hours.    No results for input(s): COVID19 in the last 720  hours.  Imaging/Diagnostics:    No results found.\    Diagnosis:      Spinal stenosis of lumbar region with neurogenic claudication; Spondylolis*    Plans:     1. LUMBAR INTERBODY FUSION POSTERIOR L3-L5      Izetta Jenkins Hai, APRN - CNP  07/08/2024  7:12 AM  "

## 2024-07-08 NOTE — Progress Notes (Signed)
"  C/O NUMBNESS LEFT THIGH TO TOES PREOP  "

## 2024-07-08 NOTE — Progress Notes (Signed)
"  Dr. Reinhold notified at shift change by prior nurse that JP drain came out when patient was moving around in bed. No new orders and will see patient in the morning.  "

## 2024-07-08 NOTE — Op Note (Signed)
 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 Item Serial No. Manufacturer Lot No. LRB No. Used Action   GRAFT BNE SUB 15GM GRN CA COMPND PTTY AND SILICATE BASE INJ - [id number]  GRAFT BNE SUB 15GM GRN CA COMPND PTTY AND SILICATE BASE INJ  [employer]WD [id number] N/A 1 Implanted   SPACER SPNL [id number] TI TRANSFORAMINAL INTBDY FUS CONVX - [id number]  SPACER SPNL [id number] TI TRANSFORAMINAL INTBDY FUS CONVX  [employer]WD [id number] N/A 1 Implanted   GRAFT BNE SUB 7.5GM PTTY SYN SIGNAFUSE - [id number]  GRAFT BNE SUB 7.5GM PTTY SYN SIGNAFUSE  [employer]WD [id number] N/A 1 Implanted   GRAFT BNE SUB 7.5GM PTTY SYN SIGNAFUSE - [id number]  GRAFT BNE SUB 7.5GM PTTY SYN SIGNAFUSE  [employer]WD [id number] N/A 1 Implanted   TOP CLSR DIA55 STD FOR THORLUM SACROILIAC FIX SYS - [id number]  TOP CLSR DIA55 STD FOR THORLUM SACROILIAC FIX SYS  [employer]  N/A 6 Implanted   SCREW SPNL L55MM DIA6. POLYAX THORLUM SACROILIAC VITALITY - [id number]  SCREW SPNL L55MM DIA6. POLYAX THORLUM SACROILIAC VITALITY  [employer]  N/A 3 Implanted   SCREW SPNL POLYAX 6.5X50 MM VITALITY - [id number]  SCREW SPNL POLYAX 6.5X50 MM VITALITY  [employer]  N/A 3 Implanted   ROD SPNL CRV 5.5X80 MM PREBENT TI VITALITY - [id number]  ROD SPNL CRV 5.5X80 MM PREBENT TI VITALITY  [employer]  N/A 2  Implanted         Drains:   Closed/Suction Drain Back Bulb (Active)       Urinary Catheter 2024-08-05 2 Way;Coude (Active)       Findings:  Present At Time Of Surgery (PATOS) (choose all levels that have infection present):  No infection present  Other Findings:     Detailed Description of Procedure:   Patient is a 68 year old male who presents with a 4-year history of ongoing back as well as bilateral lower extremity discomfort and numbness consistent with ongoing lumbar radiculopathy.  Radiographic imaging in the form of a MRI lumbar spine clearly demonstrates severe focal spinal stenosis at the L3-4 and L4-5 levels in addition to marked degenerative disc disease with associated grade 1 spondylolisthesis both at the L4-5 and slight lesser degree L3-4 levels.  Further treatment options were discussed, conservative measures versus surgical intervention.  After reviewing the radiographic images as well as discussed the details and risk of surgery, the patient wished to for going further conservative measures in favor surgical invention.  Thus he was scheduled for surgery as outlined below.    The patient was transported from the preanesthesia area to the operating room.  After the adequate induction of general tracheal anesthesia,  Foley catheter was placed.  The patient was then placed onto the operating table in the prone position onto a Wilson frame.  The appropriate pressure points about the body were carefully padded.  Perioperative IV antibiotics were given.  The region over lower back was then prepped and draped in usual sterile fashion.  The incision line was infiltrated with 10 cc local anesthetic.  At this point a midline incision was made extending over the L2-L5 spinous process levels.  This incision was carried down through the lying tissue to the level of the deep lumbar fascia.  Next using the monopolar instrument, the lumbar fascia was incised and the dissection carried down bilaterally keep immediately  adjacent to the L2-L5 spinous processes.  Next a standard subperiosteal dissection was carried out over the adjacent hemilamina.  The adjacent facet joints were exposed along with the L3, L4 and L5 transverse processes bilaterally.  Radiopaque instruments were now placed at the L2-3, L3-4 and L4-5 disc base levels.  An intraoperative x-ray verified as to be at the above-stated levels.  These instruments were removed.  McCullough retractor was placed in the wound.  At this point using the Leksell instrument the entire L4 and L3 spinous processes were removed and the corresponding hemilamina thin.  Next using a high-speed drill as well as Kerrison instruments, bilateral laminectomies were carried out sequentially at the L4 and L3 levels.  A small portion of the inferior aspect of the L2 hemilamina was removed along with any remaining ligamentous tissue allowing for further central decompression at this level.  The ligamentous tissue was then moved centrally in a caudal direction to the superior aspect of the L5 hemilamina.  A small portion of superior aspect of the L5 hemilamina was removed bilaterally as well.  There was severe hourglass deformity to the thecal sac noted at this point surgery indicative of severe spinal stenosis.  Using further careful dissection and technique, complete medial facetectomies were carried out and remaining ligamentous tissue removed thus completing the Gill procedure.  Bone was removed flush with the pedicles adjacent to the exiting nerve roots.  This would ensure adequate visualization of the nerves as well as generous decompression of the nerves themselves.  In this fashion overall generous decompression of both the thecal sac as well as exiting nerve roots was accomplished.  At the L3-4 level of the disc was quite degenerated and due to the patient's specific anatomy and interbody fusion at this level was not completed.  At the L4-5 level, the disc base was incised bilaterally and a  generous bilateral discectomy completed.  Multiple straight and angled curettes were used to denude the adjacent endplates of remaining cartilage thus preparing the disc space for fusion.  At this point generous amounts of autologous bone graft as well as Signa fuse material was placed into the anterior portion of the disc space.  The autologous bone graft was collected from the locally harvested lamina spinous process.  Next a 10 mm length interbody fusion device filled with Signa fuse material was then seated obliquely into the disc space and countersunk.  Final positioning was verified using intraoperative fluoroscopy.  Attention was now turned to the bilateral posterolateral fusion.  The high-speed drill was used to decorticate the L3, L4 and L5 transverse processes bilaterally.  At this point generous amounts of autologous bone graft as well as Signa fuse material was then placed over and adjacent to the decorticated bone thus providing for a generous bilateral posterolateral fusion at  the L3-L5 levels.  Lastly attention was turned to placement of the pedicle screws.  The pedicles were identified using K wires which were verified both in AP and lateral views.  The pedicles were then tapped over the K wires and appropriately sized Zimmer vital pedicle screws placed.  Each screw seated securely.  An appropriately sized rod was then connected screw heads and the caps torqued to the appropriate pressure.  A final intraoperative x-ray was then taken demonstrating the instrumentation to be in good position both in AP and lateral views.  The dental instrument was then used to palpate the medial wall of each pedicle bilaterally.  There was no breach of the medial pedicle noted.  Thus attention was now turned to closure of the wound.  The wound was irrigated multiple rounds of antibiotic solution.  Meticulous hemostasis was obtained both prior to and after removal of the retractor blades.  A deep drain was placed and  exited superior to the incision site.  The deep lumbar fascia as well as subcutaneous tissue and dermis was then reapproximated using multiple simple interrupted 0 and 2-0 Vicryl sutures.  The skin edges further reinforced with skin staples and a Drechsel dressing applied over this.  The sponge needle and instrument counts were correct in the case.  Estimated blood loss was approximate 100 cc.  The drain was connected to close system JP bulb suction.  The patient was noted to be in satisfactory condition in the operating at this point.  Randall Kava my physicians assistant did assist throughout the entire case including assistance throughout the entire exposure and closure as well as continuous assistance throughout the case with instrument cleaning, hemostasis and retraction of structures as required.    Electronically signed by Redell Bellingham, MD on 07/08/2024 at 11:53 AM    "

## 2024-07-08 NOTE — Plan of Care (Signed)
"  Painful and nauseous this shift A&O X 4 d/c plan are pending.    Problem: Discharge Planning  Goal: Discharge to home or other facility with appropriate resources  Outcome: Progressing     Problem: Pain  Goal: Verbalizes/displays adequate comfort level or baseline comfort level  Outcome: Progressing     Problem: Safety - Adult  Goal: Free from fall injury  Outcome: Progressing     "

## 2024-07-08 NOTE — Progress Notes (Signed)
"  Patient admitted to room 2009 post LUMBAR INTERBODY FUSION POSTERIOR L3-L5 with wife at bedside. Both oriented to room all safety measures in place call light in reach.  "

## 2024-07-08 NOTE — Consults (Signed)
 "    [facility]    Adult Hospitalist      Name: Jake Morales  MRN: [id number]     Acct: [id number]  Room: [id number]    Admit Date: 07/08/2024  5:48 AM  PCP: Verdie Dover, MD    Primary Problem  Principal Problem:    Lumbar surgical wound fluid collection, initial encounter  Active Problems:    Spinal stenosis of lumbar region with neurogenic claudication    Spondylolisthesis at L4-L5 level    Lumbar radiculopathy  Resolved Problems:    * No resolved hospital problems. *        Assesment:     Status post lumbar interbody fusion posterior L3-L5 dated 07/08/2024  Pain control  Antiemetics as needed  Ancef  IV once  IV fluids-NS at 100 mL/h    Lumbar spinal stenosis with neurogenic claudication    Hypothyroidism  Levothyroxine     Diverticulosis    Nephrolithiasis        CBC, BMP  Incentive spirometer  DVT and GI prophylaxis.        Chief Complaint:     No chief complaint on file.    Medical management      History of Present Illness:      Jake Morales is a 68 y.o.  male who presents with No chief complaint on file.    Patient underwent lumbar interbody fusion L3-L5 for spinal stenosis with neurogenic claudication without incident.  Patient reports pain to be in adequate control.  He denies any nausea or emesis.  Denies chest pain, dyspnea, orthopnea.  Denies headache, vision change, dysuria, joint swelling    I have personally reviewed the past medical history, past surgical history, medications, social history, and family history, and summarized in the note.    Review of Systems:     All 10 point system is reviewed and negative otherwise mentioned in HPI.      Past Medical History:     Past Medical History:   Diagnosis Date    Bowel obstruction (HCC) 12/2017    Bowel obstruction (HCC)     history of 2 episodes that resolved without surgery    Chronic back pain     Diarrhea     related to prior bowel surgeries    Diverticulitis     had colectomy and colostomy    Diverticulosis     Duodenal ulcer     History of  kidney stones     X1, passed on his own    Hypercholesteremia     Hypothyroid     Knee pain     Numbness and tingling     intermittant, more in left leg than right    Peptic ulcer     Personal history of prostate cancer     Prostatitis, acute     Rosacea     SOB (shortness of breath) 2024    About 1 year ago (2024) he had some problems with shortness of breath when tying his shoes and also while in [place]  because of the higher altitude.  Had testing done, no particular problems identified (per patient), had last follow up visit with Dr Merrianne about 1 month ago    Thyroid nodule     just watching it, does not affect swallowing        Past Surgical History:     Past Surgical History:   Procedure Laterality Date    APPENDECTOMY      BLADDER  SURGERY      [facility] 06-13-2009; cystoscopy    CHOLECYSTECTOMY      COLECTOMY  2001    with colostomy for diverticulitis    COLONOSCOPY      COLOSTOMY  2001    CYST REMOVAL Left     hand    FOOT NEUROMA SURGERY Bilateral     GANGLION CYST EXCISION      from back    INGUINAL HERNIA REPAIR Bilateral     NASAL SEPTUM SURGERY      X2 surgeries    PAIN MANAGEMENT PROCEDURE      multiple epidural steroid injections, SI joint injection, radiofrequency ablation    PROSTATE BIOPSY      11-28-2000    PROSTATECTOMY      01-13-2001    REVISION COLOSTOMY  2002    SMALL INTESTINE SURGERY  2001    due to blockage caused by adhesions from prior bowel surgery    SMALL INTESTINE SURGERY  1980    surgery for duodenal ulcer        Medications Prior to Admission:       Prior to Admission medications   Medication Sig Start Date End Date Taking? Authorizing Provider   oxyCODONE -acetaminophen  (PERCOCET ) 5-325 MG per tablet Take 1 tablet by mouth every 4 hours as needed for Pain for up to 7 days. Max Daily Amount: 6 tablets 07/08/24 07/15/24 Yes Fender, Delon PARAS, PA   tiZANidine  (ZANAFLEX ) 2 MG tablet Take 1 tablet by mouth 3 times daily as needed (muscle pain) 07/08/24 07/23/24 Yes Fender, Delon PARAS, PA    Azelaic Acid 15 % GEL Apply topically See Admin Instructions   Yes [provider]   levothyroxine  (SYNTHROID ) 50 MCG tablet Take 1 tablet by mouth Daily 03/22/21  Yes [provider]   Polyethylene Glycol 3350  (MIRALAX  PO) Take by mouth in the morning and at bedtime   Yes [provider]   Probiotic Product (PROBIOTIC PO) Take 1 capsule by mouth daily   Yes [provider]   Multiple Vitamins-Minerals (THERAPEUTIC MULTIVITAMIN-MINERALS) tablet Take 1 tablet by mouth daily   Yes [provider]        Allergies:       Citric acid -potassium citrate  [potassium citrate ] and Mushroom    Social History:     Tobacco:    reports that he has never smoked. He has never used smokeless tobacco.  Alcohol:      reports current alcohol use.  Drug Use:  reports no history of drug use.    Family History:     Family History   Family history unknown: Yes         Physical Exam:     Vitals:  BP (!) 153/89   Pulse 78   Temp 98.2 F (36.8 C) (Oral)   Resp 16   Ht 1.702 m (5' 7)   Wt 79.8 kg (176 lb)   SpO2 95%   BMI 27.57 kg/m   Temp (24hrs), Avg:97.6 F (36.4 C), Min:97.3 F (36.3 C), Max:98.2 F (36.8 C)          General appearance - alert, well appearing, and in no acute distress  Mental status - oriented to person, place, and time with normal affect  Head - normocephalic and atraumatic  Eyes - pupils equal and reactive, extraocular eye movements intact, conjunctiva clear  Ears - hearing appears to be intact  Nose - no drainage noted  Mouth - mucous membranes moist  Neck - supple, no carotid bruits, thyroid not palpable  Chest - clear to auscultation, normal effort  Heart - normal rate, regular rhythm, no murmur  Abdomen - soft, nontender, nondistended, bowel sounds present all four quadrants, no masses, hepatomegaly or splenomegaly.  Tender back  Neurological - normal speech, no focal findings or movement disorder noted, cranial nerves II through XII grossly  intact  Extremities - peripheral pulses palpable, no pedal edema or calf pain with palpation  Skin - no gross lesions, rashes, or induration noted        Data:     Labs:    Hematology:No results for input(s): WBC, RBC, HGB, HCT, MCV, MCH, MCHC, RDW, PLT, MPV, SEDRATE, CRP, INR, DDIMER, CD4TCELL, LABABSO in the last 72 hours.    Invalid input(s): PT  Chemistry:No results for input(s): NA, K, CL, CO2, GLUCOSE, BUN, CREATININE, MG, ANIONGAP, LABGLOM, GFRAA, CALCIUM, CAION, PHOS, PSA, PROBNP, TROPHS, CKTOTAL, CKMB, CKMBINDEX, MYOGLOBIN, DIGOXIN, LACTACIDWB in the last 72 hours.  No results for input(s): LABALBU, LABA1C, T3TOTAL, FT4, TSH, AST, ALT, LDH, GGT, ALKPHOS, BILITOT, BILIDIR, AMMONIA, AMYLASE, LIPASE, LACTATE, CHOL, HDL, CHOLHDLRATIO, TRIG, VLDL, HIV12AB, PHENYTOIN, PHENYF, URICACID, POCGLU in the last 72 hours.    Invalid input(s): PROT, T4TOTAL, LABGGT, LDLCHOLESTEROL    No results found for: INR, PROTIME    Lab Results   Component Value Date/Time    SPECIAL Site: Urine 10/22/2023 03:43 PM     Lab Results   Component Value Date/Time    CULTURE NO GROWTH 10/22/2023 03:43 PM       No results found for: POCPH, PHART, PH, POCPCO2, PCO2ART, PCO2, POCPO2, PO2ART, PO2, POCHCO3, HCO3ART, HCO3, NBEA, PBEA, BEART, BE, THGBART, THB, TCO2ART, POCO2SAT, O2SATART, O2SAT, FIO2    Radiology:    No results found.      All radiological studies reviewed                Code Status:  Full Code    Electronically signed by Pieter Munda, MD on 07/08/2024 at 6:34 PM     Copy sent to Dr. Verdie, Belvie, MD    This note was created with the assistance of a speech-recognition program.  Although the intention is to generate a document that actually reflects the content of the visit, no guarantees can be provided that every mistake has been identified  and corrected by editing.     Note was updated later by me after  physical examination and  completion of the assessment.     "

## 2024-07-08 NOTE — Anesthesia Pre Procedure (Signed)
 "Department of Anesthesiology  Preprocedure Note       Name:  Jake Morales   Age:  68 y.o.  DOB:  [birth year]                                          MRN:  [id number]         Date:  07/08/2024      Surgeon: Clotilde):  Reinhold, Redell, MD    Procedure: Procedure(s):  LUMBAR INTERBODY FUSION POSTERIOR L3-L5    Medications prior to admission:   Prior to Admission medications   Medication Sig Start Date End Date Taking? Authorizing Provider   Azelaic Acid 15 % GEL Apply topically See Admin Instructions   Yes [provider]   ibuprofen (ADVIL;MOTRIN) 200 MG tablet Take 1-3 tablets by mouth 2 times daily as needed for Pain   Yes [provider]   levothyroxine  (SYNTHROID ) 50 MCG tablet Take 1 tablet by mouth Daily 03/22/21  Yes [provider]   Polyethylene Glycol 3350  (MIRALAX  PO) Take by mouth in the morning and at bedtime   Yes [provider]   Probiotic Product (PROBIOTIC PO) Take 1 capsule by mouth daily   Yes [provider]   Multiple Vitamins-Minerals (THERAPEUTIC MULTIVITAMIN-MINERALS) tablet Take 1 tablet by mouth daily   Yes [provider]       Current medications:    Current Facility-Administered Medications   Medication Dose Route Frequency Provider Last Rate Last Admin    lidocaine  PF 1 % injection 1 mL  1 mL IntraDERmal Once PRN Farah, Ndal M, MD        0.9 % sodium chloride  infusion   IntraVENous Continuous Malinda Hassan HERO, MD        lactated ringers  infusion   IntraVENous Continuous Farah, Ndal M, MD 125 mL/hr at 07/08/24 0636 New Bag at 07/08/24 0636    sodium chloride  flush 0.9 % injection 5-40 mL  5-40 mL IntraVENous 2 times per day Malinda Hassan HERO, MD        sodium chloride  flush 0.9 % injection 5-40 mL  5-40 mL IntraVENous PRN Farah, Ndal M, MD        0.9 % sodium chloride  infusion   IntraVENous PRN Farah, Ndal M, MD           Allergies:    Allergies   Allergen Reactions    Citric Acid -Potassium Citrate  [Potassium Citrate ] Diarrhea    Mushroom         Problem List:    Patient Active Problem List   Diagnosis Code    Personal history of malignant neoplasm of prostate Z85.46    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    Microscopic hematuria R31.29    Urethral stricture N35.919    Stress incontinence, male N39.3    Mixed hyperlipidemia E78.2    Irritable bowel syndrome K58.9    Hyperlipidemia E78.5    Hypoglycemia E16.2    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.31    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    History of kidney stones Z87.442    Spinal stenosis of lumbar region with neurogenic claudication M48.062    Spondylolisthesis at L4-L5 level M43.16    Lumbar radiculopathy M54.16    Renal calculus, left N20.0       Past Medical History:        Diagnosis Date    Bowel  obstruction (HCC) 12/2017    Bowel obstruction (HCC)     history of 2 episodes that resolved without surgery    Chronic back pain     Diarrhea     related to prior bowel surgeries    Diverticulitis     had colectomy and colostomy    Diverticulosis     Duodenal ulcer     History of kidney stones     X1, passed on his own    Hypercholesteremia     Hypothyroid     Knee pain     Numbness and tingling     intermittant, more in left leg than right    Peptic ulcer     Personal history of prostate cancer     Prostatitis, acute     Rosacea     SOB (shortness of breath) 2024    About 1 year ago (2024) he had some problems with shortness of breath when tying his shoes and also while in [place]  because of the higher altitude.  Had testing done, no particular problems identified (per patient), had last follow up visit with Dr Merrianne about 1 month ago    Thyroid nodule     just watching it, does not affect swallowing       Past Surgical History:        Procedure Laterality Date    APPENDECTOMY      BLADDER SURGERY      [facility] 06-13-2009; cystoscopy    CHOLECYSTECTOMY      COLECTOMY  2001    with colostomy for diverticulitis    COLONOSCOPY      COLOSTOMY  2001    CYST REMOVAL Left     hand    FOOT NEUROMA SURGERY  Bilateral     GANGLION CYST EXCISION      from back    INGUINAL HERNIA REPAIR Bilateral     NASAL SEPTUM SURGERY      X2 surgeries    PAIN MANAGEMENT PROCEDURE      multiple epidural steroid injections, SI joint injection, radiofrequency ablation    PROSTATE BIOPSY      11-28-2000    PROSTATECTOMY      01-13-2001    REVISION COLOSTOMY  2002    SMALL INTESTINE SURGERY  2001    due to blockage caused by adhesions from prior bowel surgery    SMALL INTESTINE SURGERY  1980    surgery for duodenal ulcer       Social History:    Social History     Tobacco Use    Smoking status: Never    Smokeless tobacco: Never   Substance Use Topics    Alcohol use: Yes     Comment: once in a while                                Counseling given: Not Answered      Vital Signs (Current):   Vitals:    07/08/24 0606 07/08/24 0608   BP: (!) 144/96    Pulse: 73    Resp: 16    Temp: 97.3 F (36.3 C)    SpO2: 96%    Weight:  79.8 kg (176 lb)   Height:  1.702 m (5' 7)  BP Readings from Last 3 Encounters:   07/08/24 (!) 144/96   06/08/24 (!) 119/59   05/10/24 136/80       NPO Status: Time of last liquid consumption: 2200                        Time of last solid consumption: 2200                        Date of last liquid consumption: 07/07/24                        Date of last solid food consumption: 07/07/24    BMI:   Wt Readings from Last 3 Encounters:   07/08/24 79.8 kg (176 lb)   07/07/24 80.3 kg (177 lb)   06/08/24 80.3 kg (177 lb)     Body mass index is 27.57 kg/m.    CBC:   Lab Results   Component Value Date/Time    WBC 5.4 12/03/2016 12:20 AM    RBC 4.38 12/03/2016 12:20 AM    HGB 12.2 12/03/2016 12:20 AM    HCT 37.2 12/03/2016 12:20 AM    MCV 85.1 12/03/2016 12:20 AM    RDW 15.9 12/03/2016 12:20 AM    PLT 223 12/03/2016 12:20 AM       CMP:   Lab Results   Component Value Date/Time    NA 142 12/03/2016 12:20 AM    K 3.7 12/03/2016 12:20 AM    CL 103 12/03/2016 12:20 AM    CO2 24 12/03/2016 12:20  AM    BUN 26 05/31/2024 12:00 AM    CREATININE 1.23 05/31/2024 12:00 AM    GFRAA >60 12/03/2016 12:20 AM    LABGLOM >60 12/03/2016 12:20 AM    GLUCOSE 75 12/03/2016 01:24 AM    CALCIUM 8.6 12/03/2016 12:20 AM       POC Tests: No results for input(s): POCGLU, POCNA, POCK, POCCL, POCBUN, POCHEMO, POCHCT in the last 72 hours.    Coags: No results found for: PROTIME, INR, APTT    HCG (If Applicable): No results found for: PREGTESTUR, PREGSERUM, HCG, HCGQUANT     ABGs: No results found for: PHART, PO2ART, PCO2ART, HCO3ART, BEART, O2SATART     Type & Screen (If Applicable):  Lab Results   Component Value Date    ABORH O NEGATIVE 06/08/2024    LABANTI NEGATIVE 06/08/2024       Drug/Infectious Status (If Applicable):  No results found for: HIV, HEPCAB    COVID-19 Screening (If Applicable): No results found for: COVID19        Anesthesia Evaluation    Patient summary reviewed and Nursing notes reviewed     no history of anesthetic complications:  Airway:  Mallampati: II  TM distance: >3 FB   Neck ROM: full    Mouth opening: > = 3 FB   Dental:           Pulmonary:           (-) COPD, asthma and not a current smoker         Cardiovascular:     Exercise tolerance: no interval change    (+)                                       hyperlipidemia      (-)  pacemaker, past MI, CAD and  angina    ECG reviewed    Rate: normal                Neuro/Psych:    (+)   neuromuscular disease:               (-) CVA          GI/Hepatic/Renal:    (+)         renal disease: kidney stones          Endo/Other:                      Abdominal:              Vascular:         Other Findings:            Anesthesia Plan      general     ASA 3       Induction: intravenous.    MIPS: Postoperative opioids intended and Prophylactic antiemetics administered.  Anesthetic plan and risks discussed with patient.      Plan discussed with CRNA.                    Delinda LELON Essex, DO   07/08/2024            "

## 2024-07-09 LAB — CBC WITH AUTO DIFFERENTIAL
Basophils %: 0 %
Basophils Absolute: 0 k/uL (ref 0.0–0.2)
Eosinophils %: 0 % — ABNORMAL LOW (ref 1–4)
Eosinophils Absolute: 0 k/uL (ref 0.0–0.4)
Hematocrit: 34.1 % — ABNORMAL LOW (ref 40.7–50.3)
Hemoglobin: 11.4 g/dL — ABNORMAL LOW (ref 13.0–17.0)
Immature Granulocytes %: 1 % — ABNORMAL HIGH
Immature Granulocytes Absolute: 0.1 k/uL (ref 0.00–0.30)
Lymphocytes %: 6 % — ABNORMAL LOW (ref 24–44)
Lymphocytes Absolute: 0.62 k/uL — ABNORMAL LOW (ref 1.0–4.8)
MCH: 31.2 pg (ref 25.2–33.5)
MCHC: 33.4 g/dL (ref 28.0–38.0)
MCV: 93.4 fL (ref 82.6–102.9)
MPV: 9.4 fL (ref 8.1–13.5)
Monocytes %: 11 % — ABNORMAL HIGH (ref 1–7)
Monocytes Absolute: 1.14 k/uL — ABNORMAL HIGH (ref 0.2–0.8)
Neutrophils %: 82 % — ABNORMAL HIGH (ref 36–66)
Neutrophils Absolute: 8.54 k/uL — ABNORMAL HIGH (ref 1.8–7.7)
Platelets: 212 k/uL (ref 138–453)
RBC: 3.65 m/uL — ABNORMAL LOW (ref 4.21–5.77)
RDW: 12.1 % (ref 11.8–14.4)
WBC: 10.4 k/uL (ref 3.5–11.3)

## 2024-07-09 LAB — BASIC METABOLIC PANEL
Anion Gap: 9 mmol/L (ref 9–16)
BUN: 16 mg/dL (ref 8–23)
CO2: 25 mmol/L (ref 20–31)
Calcium: 8.2 mg/dL — ABNORMAL LOW (ref 8.8–10.2)
Chloride: 104 mmol/L (ref 98–107)
Creatinine: 1.3 mg/dL — ABNORMAL HIGH (ref 0.70–1.20)
Est, Glom Filt Rate: 62 mL/min/1.73m2 (ref >60)
Glucose: 152 mg/dL — ABNORMAL HIGH (ref 82–115)
Potassium: 3.6 mmol/L — ABNORMAL LOW (ref 3.7–5.3)
Sodium: 137 mmol/L (ref 136–145)

## 2024-07-09 MED ORDER — PHENOL 1.4 % MT LIQD
1.4 | OROMUCOSAL | Status: DC | PRN
Start: 2024-07-09 — End: 2024-07-09
  Administered 2024-07-09: 04:00:00 1 via OROMUCOSAL

## 2024-07-09 MED ORDER — DOCUSATE SODIUM 100 MG PO CAPS
100 | Freq: Every day | ORAL | Status: DC
Start: 2024-07-09 — End: 2024-07-09
  Administered 2024-07-09: 18:00:00 100 mg via ORAL

## 2024-07-09 MED ORDER — PROCHLORPERAZINE EDISYLATE 10 MG/2ML IJ SOLN
10 | Freq: Four times a day (QID) | INTRAMUSCULAR | Status: DC | PRN
Start: 2024-07-09 — End: 2024-07-09
  Administered 2024-07-09: 04:00:00 10 mg via INTRAVENOUS

## 2024-07-09 MED FILL — MORPHINE SULFATE 4 MG/ML IJ SOLN: 4 mg/mL | INTRAMUSCULAR | Qty: 1

## 2024-07-09 MED FILL — OXYCODONE-ACETAMINOPHEN 5-325 MG PO TABS: 5-325 mg | ORAL | Qty: 1 | Fill #0

## 2024-07-09 MED FILL — ONDANSETRON HCL 4 MG/2ML IJ SOLN: 4 MG/2ML | INTRAMUSCULAR | Qty: 2 | Fill #0

## 2024-07-09 MED FILL — DOCUSATE SODIUM 100 MG PO CAPS: 100 mg | ORAL | Qty: 1 | Fill #0

## 2024-07-09 MED FILL — LEVOTHYROXINE SODIUM 50 MCG PO TABS: 50 ug | ORAL | Qty: 1 | Fill #0

## 2024-07-09 MED FILL — PROCHLORPERAZINE EDISYLATE 10 MG/2ML IJ SOLN: 10 MG/2ML | INTRAMUSCULAR | Qty: 2

## 2024-07-09 MED FILL — OXYCODONE-ACETAMINOPHEN 5-325 MG PO TABS: 5-325 mg | ORAL | Qty: 2 | Fill #0

## 2024-07-09 MED FILL — SORE THROAT SPRAY 1.4 % MT LIQD: 1.4 % | OROMUCOSAL | Qty: 177

## 2024-07-09 NOTE — Plan of Care (Addendum)
"    Problem: Discharge Planning  Goal: Discharge to home or other facility with appropriate resources  07/09/2024 2119 by Theresia Pfeiffer, RN  Outcome: Completed  07/09/2024 1814 by Leigh Knight, RN  Outcome: Adequate for Discharge     Problem: Pain  Goal: Verbalizes/displays adequate comfort level or baseline comfort level  07/09/2024 2119 by Theresia Pfeiffer, RN  Outcome: Completed  07/09/2024 1814 by Leigh Knight, RN  Outcome: Adequate for Discharge     Problem: Safety - Adult  Goal: Free from fall injury  07/09/2024 2119 by Theresia Pfeiffer, RN  Outcome: Completed  07/09/2024 1814 by Leigh Knight, RN  Outcome: Adequate for Discharge     Problem: Neurosensory - Adult  Goal: Achieves maximal functionality and self care  07/09/2024 2119 by Theresia Pfeiffer, RN  Outcome: Completed  07/09/2024 1814 by Leigh Knight, RN  Outcome: Adequate for Discharge     Problem: Gastrointestinal - Adult  Goal: Minimal or absence of nausea and vomiting  07/09/2024 2119 by Theresia Pfeiffer, RN  Outcome: Completed  07/09/2024 1814 by Leigh Knight, RN  Outcome: Adequate for Discharge  Goal: Maintains or returns to baseline bowel function  07/09/2024 2119 by Theresia Pfeiffer, RN  Outcome: Completed  07/09/2024 1814 by Leigh Knight, RN  Outcome: Adequate for Discharge     "

## 2024-07-09 NOTE — Care Coordination (Signed)
 "Case Management Assessment  Initial Evaluation    Date/Time of Evaluation: 07/09/2024 9:25 AM  Assessment Completed by: SUZEN PYLES, RN    If patient is discharged prior to next notation, then this note serves as note for discharge by case management.    Patient Name: Jake Morales                   Date of Birth: 03-01-1956  Diagnosis: Spinal stenosis of lumbar region with neurogenic claudication [M48.062]  Spondylolisthesis at L4-L5 level [M43.16]  Lumbar radiculopathy [M54.16]  Lumbar surgical wound fluid collection, initial encounter [T81.89XA]                   Date / Time: 07/08/2024  5:48 AM    Patient Admission Status: Observation   Readmission Risk (Low < 19, Mod (19-27), High > 27): No data recorded  Current PCP: Verdie Dover, MD  PCP verified by CM? Yes    Chart Reviewed: Yes      History Provided by: Patient  Patient Orientation:      Patient Cognition: No Apparent Deficit    Hospitalization in the last 30 days (Readmission):  No    If yes, Readmission Assessment in CM Navigator will be completed.    Advance Directives:      Code Status: Full Code   Patient's Primary Decision Maker is: Legal Next of Kin      Discharge Planning:    Patient lives with: Spouse Type of Home: Trailer/Mobile Home  Primary Care Giver: Self  Patient Support Systems include: Spouse/Significant Other   Current community resources: None  Current services prior to admission: None            Current DME:              Type of Home Care services:       ADLS  Prior functional level: Independent in ADLs/IADLs  Current functional level: Assistance with the following:, Mobility, Toileting    PT AM-PAC:   /24  OT AM-PAC:   /24    Family can provide assistance at DC: Yes  Would you like Case Management to discuss the discharge plan with any other family members/significant others, and if so, who?    Plans to Return to Present Housing: Yes  Other Identified Issues/Barriers to RETURNING to current housing: medical condition  Potential  Assistance needed at discharge: N/A            Potential DME:    Patient expects to discharge to: Home  Plan for transportation at discharge: Family    Financial    Payor: [employer]HC MEDICARE / Plan: [employer]HC MEDICARE COMPLETE / Product Type: *No Product type* /     Does insurance require precert for SNF: Yes    Potential assistance Obtaining Medications: No  Meds-to-Beds request:        [facility] [address] [place] - [address] [address] [phone] - F [phone]  [address] [address] [facility] [address]  Phone: [phone] Fax: [phone]      Notes:    Factors facilitating achievement of predicted outcomes: Family support, Cooperative, and Pleasant    Barriers to discharge: Medical complications    Additional Case Management Notes: post op lumbar fusion. Plan is home today independently. Lives with spouse. Declines any skilled needs.     The Plan for Transition of Care is related to the following treatment goals of Spinal stenosis of lumbar region with neurogenic claudication [  M48.062]  Spondylolisthesis at L4-L5 level [M43.16]  Lumbar radiculopathy [M54.16]  Lumbar surgical wound fluid collection, initial encounter [T81.89XA]    IF APPLICABLE: The Patient and/or patient representative Jehan and his family were provided with a choice of provider and agrees with the discharge plan. Freedom of choice list with basic dialogue that supports the patient's individualized plan of care/goals and shares the quality data associated with the providers was provided to: Patient   Patient Representative Name:       The Patient and/or Patient Representative Agree with the Discharge Plan? Yes    Donnabelle Blanchard, RN  Case Management Department      "

## 2024-07-09 NOTE — Plan of Care (Signed)
"    Problem: Discharge Planning  Goal: Discharge to home or other facility with appropriate resources  07/09/2024 1814 by Leigh Knight, RN  Outcome: Adequate for Discharge     Problem: Pain  Goal: Verbalizes/displays adequate comfort level or baseline comfort level  07/09/2024 1814 by Leigh Knight, RN  Outcome: Adequate for Discharge     Problem: Safety - Adult  Goal: Free from fall injury  07/09/2024 1814 by Leigh Knight, RN  Outcome: Adequate for Discharge     Problem: Neurosensory - Adult  Goal: Achieves maximal functionality and self care  07/09/2024 1814 by Leigh Knight, RN  Outcome: Adequate for Discharge     Problem: Gastrointestinal - Adult  Goal: Minimal or absence of nausea and vomiting  07/09/2024 1814 by Leigh Knight, RN  Outcome: Adequate for Discharge     Problem: Gastrointestinal - Adult  Goal: Maintains or returns to baseline bowel function  07/09/2024 1814 by Leigh Knight, RN  Outcome: Adequate for Discharge     "

## 2024-07-09 NOTE — Progress Notes (Addendum)
"  Patient was able to tolerate lunch, if he can tolerate dinner per Dr. Reinhold he will be discharged.  "

## 2024-07-09 NOTE — Discharge Summary (Signed)
"  Admission date:07/08/2024  Discharge date:07/09/2024  Admitting Dx/Principal Dx: Lumbar spinal stenosis/Lumbar spondylolisthesis  Surgeon: Dr. Redell Bellingham  Procedure: L3-5 PLIF  Pt was admitted to med/surg floor. During their stay they received IVF, pain control, wound care, and nursing care. There were no complications during their stay. On day of discharge their VSS, OOB to halls, eating and drinking well, urinating normally.  Pt was discharged on POD# 1, without complications during stay.  Pt was sent home on Percocet  and Zanaflex .  F/U with Dr. Bellingham in 2 weeks for first postoperative visit.  Disposition:Home  Condition on discharge:good    "

## 2024-07-09 NOTE — Progress Notes (Signed)
 "Occupational Therapy Initial Evaluation  Facility/Department: [facility]   Patient Name: Jake Morales        MRN: [id number]    DOB: [birth year]    Date of Service: 07/09/2024    RN Percell reports patient is medically stable for therapy treatment this date.    Chart reviewed prior to treatment and patient is agreeable for therapy.  All lines intact and patient positioned comfortably at end of treatment.  All patient needs addressed prior to ending therapy session.       Discharge Recommendations  Discharge Recommendations: Home with assist PRN  OT Equipment Recommendations  Equipment Needed: Yes  Mobility Devices: ADL Assistive Devices  ADL Assistive Devices: Sock-Aid Hard, Long-handled Shoe Horn, Long-handled Sponge    No chief complaint on file.      Pt is s/p:     Date of Procedure: 07-25-2024     Pre-Op Diagnosis Codes:      * Spinal stenosis of lumbar region with neurogenic claudication [M48.062]     * Spondylolisthesis at L4-L5 level [M43.16]     * Lumbar radiculopathy [M54.16]     Post-Op Diagnosis: Same       Procedure(s):  LUMBAR INTERBODY FUSION POSTERIOR L3-L5, C ARM     Surgeon(s):  Hoeflinger, Redell, MD      Past Medical History:  has a past medical history of Bowel obstruction (HCC), Bowel obstruction (HCC), Chronic back pain, Diarrhea, Diverticulitis, Diverticulosis, Duodenal ulcer, History of kidney stones, Hypercholesteremia, Hypothyroid, Knee pain, Numbness and tingling, Peptic ulcer, Personal history of prostate cancer, Prostatitis, acute, Rosacea, SOB (shortness of breath), and Thyroid nodule.  Past Surgical History:  has a past surgical history that includes Appendectomy; Cholecystectomy; Colonoscopy; Prostate Biopsy; Prostatectomy; Bladder surgery; colostomy (2001); Revision Colostomy (2002); colectomy (2001); Small intestine surgery (2001); Small intestine surgery (1980); Inguinal hernia repair (Bilateral); Foot neuroma surgery (Bilateral); Nasal septum surgery; cyst removal (Left); Ganglion  cyst excision; Pain management procedure; and lumbar fusion (N/A, 2024/07/25).    Assessment  Performance deficits / Impairments: Decreased functional mobility ;Decreased ADL status;Decreased safe awareness;Decreased balance;Decreased endurance;Decreased strength;Decreased high-level IADLs;Decreased sensation  Assessment: Pt presents with deficits in functional mobility and ADL performance s/p L3-5 PLIF on 2024/07/25. Most limited at this time by spinal precautions, surgical pain, and decreased functional activity tolerance. Orthostatic vitals assessed during session, with pt's diastolic BP dropping > 10 points during sup>sit transfer, pt asymptomatic and BP improved following activity. See flowsheet for details. Pt completed bed mobility tasks with IND, ADL transfers/functional mobility tasks with CGA and RW and able to complete UB/LB dressings tasks during session with overall MinA and use of long handled AE. Skilled OT services are indicated at this time to maximize this pt's safety and IND with self care tasks and to facilitate safe discharge home.  Prognosis: Good  Decision Making: Medium Complexity  REQUIRES OT FOLLOW-UP: Yes  Activity Tolerance  Activity Tolerance: Patient limited by pain, Patient limited by endurance  Safety Devices  Type of Devices: All fall risk precautions in place, Call light within reach, Bed alarm in place, Gait belt, Nurse notified, Left in bed, Patient at risk for falls    Restrictions/Precautions  Restrictions/Precautions  Restrictions/Precautions: Surgical Protocols;Fall Risk;General Precautions  Activity Level:  (Activity as tolerated)  Required Braces or Orthoses?: Yes  Required Braces or Orthoses  Spinal: Lumbar Corset  Position Activity Restriction  Spinal Precautions: No Bending/Lifting/Twisting (BLT)  Other Position/Activity Restrictions: Ambulate pt, R hand IV, ck BP  Restraints  Restraints  Initially in Place: No       Subjective  General  Chart Reviewed: Yes  Patient assessed  for rehabilitation services?: Yes  Family / Caregiver Present: No  Subjective  Subjective: Pt resting in bed upon entry to room, pleasant and cooperative with therapy evaluation. Pt c/o surgical pain in back throughout session, unrated - could not tolerate sitting up in bedside chair following session, so returned to bed/positioned comfortably at session end.          Home Setup/Prior Level of Function  Social/Functional History  Lives With: Spouse  Type of Home: Mobile home  Home Layout: One level  Home Access: Stairs to enter with rails;Ramped entrance  Entrance Stairs - Number of Steps: 4  Entrance Stairs - Rails: Both  Bathroom Shower/Tub: Pension Scheme Manager: Midwife: Biomedical Scientist: Accessible  Home Equipment: Reacher;Grab bars (States he may have a RW, or will borrow one from a family member)  Actor Help From: Family  Prior Level of Assist for ADLs: Independent  Prior Level of Assist for [employer]: Independent  Prior Level of Assist for Ambulation: Independent household ambulator, with or without device (with no device)  Prior Level of Assist for Transfers: Surveyor, Minerals: Yes  Occupation: Part time employment  Type of Occupation: 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 [employer]    Vision/Hearing  Vision  Vision: Impaired  Vision Exceptions: Wears glasses at all times  Hearing  Hearing: Within functional limits    BUE Assessment  Gross Assessment  AROM: Within functional limits  Strength: Within functional limits (Observed functionally)  Coordination: Within functional limits  Tone: Normal  Sensation: Impaired (Pt reports some numbness to toes on L foot; states BLE's had numbness prior to surgery)     Objective  Orientation  Overall Orientation Status: Within Functional Limits  Cognition  Overall Cognitive Status: WFL  Safety Judgement: Decreased  awareness of need for safety;Decreased awareness of need for assistance  Problem Solving: Decreased awareness of errors;Assistance required to implement solutions  Insights: Decreased awareness of deficits    Observation/Palpation  Posture: Good  Observation: Resting in bed; surgical dressing dry & intact; LSO donned for mobility; BMI 27.57; Orthostatic vitals checked during session, diastolic pressure dropped > 10 points from sup > stand - pt asymptomatic (see flowsheet for BP readings)    Activities of Daily Living  Feeding: Independent  Grooming: Setup;Stand by assistance  UE Bathing: Setup;Minimal assistance  LE Bathing: Setup;Minimal assistance  UE Dressing: Minimal assistance  UE Dressing Skilled Clinical Factors: MinA to don LSO; set up assist to doff gown & to don pullover shirt while seated in chair  LE Dressing: Contact guard assistance  LE Dressing Skilled Clinical Factors: Pt educated on compensatory LB dressing tech and on how to use long handled AE for LB dressing tasks to ensure adherence to spinal restrictions. Verbal instruction & visual demo provided on use of reacher, sock aid & shoe horn. Pt able to don boxers & pants using reacher, MinA to use sock aid  Putting On/Taking Off Footwear: Minimal assistance  Toileting: Contact guard assistance  Additional Comments: Pt educated on spinal precautions, including limiting bending, lifting and twisting at spine, and on purpose/wear schedule of LSO. Facilitated ADL training with pt this session to teach/train pt in compensatory LB ADL tech post spinal surgery. Verbal instruction &  visual demo provided to pt on compensatory LB dressing/bathing/toileting tech and use of long handled AE (reacher, sock aid, shoe horn, sponge, toilet hygiene aid) to assist with safety/IND with self care. Printed handouts provided at session end. Pt additionally educated on potential equipment needs for home and on importance of frequently changing position/regularly mobilizing  at discharge to reduce the risk of sedentary complications.    Balance  Balance  Sitting: Intact  Standing: High guard (CGA with RW)    Transfers/Mobility  Bed mobility  Rolling to Right: Independent  Supine to Sit: Independent  Sit to Supine: Independent  Scooting: Independent  Bed Mobility Comments: Pt educated on safe use of logroll tech to ensure adherence to spinal precautions during bed mobility tasks. Pt provided with step by step instruction on tech, cued to roll onto side, reach for bedrail and then use UE's to bring trunk into an upright position. Verbal cues required to use tech to return to supine at session end, as pt attempting to lay back into bed vs laying down onto side first.    Orthostatic B/P and Pulse?: Yes  Blood Pressure Lying: 105/70 (MAP 75)  Pulse Lying: 80 PER MINUTE  Blood Pressure Sitting: 113/62 (MAP 77)  Pulse Sitting: 81 PER MINUTE  Blood Pressure Standing: 106/54 (MAP 70)    Transfers  Sit to stand: Contact guard assistance  Stand to sit: Contact guard assistance  Transfer Comments: Verbal cues to push up from EOB surface vs pulling up on RW, to fully square RW to transfer surface, backing up until LE's are touching suface and reaching back for slow/controlled descent.         Functional Mobility: Contact guard assistance  Functional Mobility Skilled Clinical Factors: LSO donned prior to mobility & orthostatic vitals assessed. Pt asymptomatic throughout activity; diastolic BP did drop > 10 points from sup>sit (see flowsheet). Pt educated on RW safety/mgnt when up in function, including importance of maintaining BOS within RW, scanning the environment/obstacle negotiation, upright posture and how to safely approach transfer surfaces. Pt completed 2 trials of functional mobility during session, first EOB > nurse's station in hallway > toilet > chair and then chair > into hallway past nurses station > back to EOB in room.           Patient Education  Patient Education  Education Given  To: Patient  Education Provided: Role of Therapy;Plan of Care;Transfer Training;ADL Adaptive Strategies;Fall Prevention Strategies;Mobility Training;Energy Conservation;Precautions;Equipment  Education Method: Demonstration;Verbal;Teach Back;Printed Information/Hand-outs  Barriers to Learning: None  Education Outcome: Continued education needed;Verbalized understanding;Demonstrated understanding    Access Code: [email or web address]  URL: [email or web address]  Date: 07/09/2024  Prepared by: Marry Mau    Patient Education  - Lumbar Precautions  - Logroll With Lumbar Precautions  - Adaptive Equipment for Dressing  - Putting On Socks with a Sock Aid  - Putting On and Taking Off Pants Using a Reacher  - Using a Toileting Aid (Clamp)      Goals  Patient Goals   Patient goals : To go home!  Short Term Goals  Time Frame for Short Term Goals: By discharge, pt to demo:  Short Term Goal 1: bed mobility tasks to MOD I/IND with Good use of logroll tech and without use of bedrails.  Short Term Goal 2: ADL transfers and functional mobility tasks to MOD I with Good safety and use of RW.  Short Term Goal 3: UB ADLs, including donning/doffing LSO, and LB ADLs to SUP/MOD I with Good adherence  to spinal precautions and use of RW/AE as needed.  Short Term Goal 4: toileting routine to SUP/MOD I with Good safety and use of RW/grab bars as needed.  Short Term Goal 5: Pt/family to be IND with spinal precautions & LSO education, fall prevention strategies, EC/WS tech, compensatory ADL tech, incentive spirometer use, and potential equipment/discharge recommendations, with use of handouts as needed.    Plan  Occupational Therapy Plan  Times Per Week: 5-6x/week 1-2x/day as tolerated  Specific Instructions for Next Treatment: Long handled AE & ADL training  Current Treatment Recommendations: Balance training, Functional mobility training, Strengthening, Endurance training, Safety education & training, Pain management, Positioning,  Modalities, Equipment evaluation, education, & procurement, Patient/Caregiver education & training, Self-Care / ADL    AM-PAC Daily Activities Inpatient  AM-PAC Daily Activity - Inpatient   How much help is needed for putting on and taking off regular lower body clothing?: A Little  How much help is needed for bathing (which includes washing, rinsing, drying)?: A Little  How much help is needed for toileting (which includes using toilet, bedpan, or urinal)?: A Little  How much help is needed for putting on and taking off regular upper body clothing?: A Little  How much help is needed for taking care of personal grooming?: None  How much help for eating meals?: None  AM-PAC Inpatient Daily Activity Raw Score: 20  AM-PAC Inpatient ADL T-Scale Score : 42.03  ADL Inpatient CMS 0-100% Score: 38.32  ADL Inpatient CMS G-Code Modifier : CJ    Minutes  OT Individual Minutes  Time In: 1055  Time Out: 1217  Minutes: 82  Tx Time: 68 minutes    Electronically signed by Marry Mau, OT on 07/09/24 at 12:38 PM EDT    "

## 2024-07-09 NOTE — Progress Notes (Signed)
 Pt discharged to home in stable condition with belongings  Discharge instructions given  Pt denies having any further questions at this time  Patient/family state they have everything they were admitted with.

## 2024-07-09 NOTE — Progress Notes (Signed)
 "    [facility]    Adult Hospitalist      Name: Jake Morales  MRN: [id number]     Acct: [id number]  Room: [id number]    Admit Date: 07/08/2024  5:48 AM  PCP: Verdie Dover, MD    Primary Problem  Principal Problem:    Lumbar surgical wound fluid collection, initial encounter  Active Problems:    Spinal stenosis of lumbar region with neurogenic claudication    Spondylolisthesis at L4-L5 level    Lumbar radiculopathy  Resolved Problems:    * No resolved hospital problems. *        Assesment:     Status post lumbar interbody fusion posterior L3-L5 dated 07/08/2024  Pain control  Antiemetics as needed  Ancef  IV once  IV fluids-NS at 100 mL/h  Hemodynamically stable  Labs unremarkable    Lumbar spinal stenosis with neurogenic claudication    Hypothyroidism  Levothyroxine     Diverticulosis    Nephrolithiasis    Hypokalemia  Supplement potassium    Chronic CKD 3a  Advise hydration        CBC, BMP  Incentive spirometer  DVT and GI prophylaxis.        Chief Complaint:     No chief complaint on file.    Medical management      History of Present Illness:      Patient seen and examined at bedside  Last 24-hour events reviewed with nursing  Patient feels much better today  Able to lay on his back  Denies chest pain, dyspnea, orthopnea  Denies nausea, vomiting, abdominal pain      HPI  Jake Morales is a 68 y.o.  male who presents with No chief complaint on file.    Patient underwent lumbar interbody fusion L3-L5 for spinal stenosis with neurogenic claudication without incident.  Patient reports pain to be in adequate control.  He denies any nausea or emesis.  Denies chest pain, dyspnea, orthopnea.  Denies headache, vision change, dysuria, joint swelling    I have personally reviewed the past medical history, past surgical history, medications, social history, and family history, and summarized in the note.    Review of Systems:     All 10 point system is reviewed and negative otherwise mentioned in HPI.      Past  Medical History:     Past Medical History:   Diagnosis Date    Bowel obstruction (HCC) 12/2017    Bowel obstruction (HCC)     history of 2 episodes that resolved without surgery    Chronic back pain     Diarrhea     related to prior bowel surgeries    Diverticulitis     had colectomy and colostomy    Diverticulosis     Duodenal ulcer     History of kidney stones     X1, passed on his own    Hypercholesteremia     Hypothyroid     Knee pain     Numbness and tingling     intermittant, more in left leg than right    Peptic ulcer     Personal history of prostate cancer     Prostatitis, acute     Rosacea     SOB (shortness of breath) 2024    About 1 year ago (2024) he had some problems with shortness of breath when tying his shoes and also while in [place]  because of the higher altitude.  Had testing  done, no particular problems identified (per patient), had last follow up visit with Dr Merrianne about 1 month ago    Thyroid nodule     just watching it, does not affect swallowing        Past Surgical History:     Past Surgical History:   Procedure Laterality Date    APPENDECTOMY      BLADDER SURGERY      [facility] 06-13-2009; cystoscopy    CHOLECYSTECTOMY      COLECTOMY  2001    with colostomy for diverticulitis    COLONOSCOPY      COLOSTOMY  2001    CYST REMOVAL Left     hand    FOOT NEUROMA SURGERY Bilateral     GANGLION CYST EXCISION      from back    INGUINAL HERNIA REPAIR Bilateral     LUMBAR FUSION N/A 07/08/2024    LUMBAR INTERBODY FUSION POSTERIOR L3-L5, C ARM performed by Reinhold Rogue, MD at [facility]    NASAL SEPTUM SURGERY      X2 surgeries    PAIN MANAGEMENT PROCEDURE      multiple epidural steroid injections, SI joint injection, radiofrequency ablation    PROSTATE BIOPSY      11-28-2000    PROSTATECTOMY      01-13-2001    REVISION COLOSTOMY  2002    SMALL INTESTINE SURGERY  2001    due to blockage caused by adhesions from prior bowel surgery    SMALL INTESTINE SURGERY  1980    surgery for duodenal ulcer         Medications Prior to Admission:       Prior to Admission medications   Medication Sig Start Date End Date Taking? Authorizing Provider   oxyCODONE -acetaminophen  (PERCOCET ) 5-325 MG per tablet Take 1 tablet by mouth every 4 hours as needed for Pain for up to 7 days. Max Daily Amount: 6 tablets 07/08/24 07/15/24 Yes Fender, Delon PARAS, PA   tiZANidine  (ZANAFLEX ) 2 MG tablet Take 1 tablet by mouth 3 times daily as needed (muscle pain) 07/08/24 07/23/24 Yes Fender, Delon PARAS, PA   Azelaic Acid 15 % GEL Apply topically See Admin Instructions   Yes [provider]   levothyroxine  (SYNTHROID ) 50 MCG tablet Take 1 tablet by mouth Daily 03/22/21  Yes [provider]   Polyethylene Glycol 3350  (MIRALAX  PO) Take by mouth in the morning and at bedtime   Yes [provider]   Probiotic Product (PROBIOTIC PO) Take 1 capsule by mouth daily   Yes [provider]   Multiple Vitamins-Minerals (THERAPEUTIC MULTIVITAMIN-MINERALS) tablet Take 1 tablet by mouth daily   Yes [provider]        Allergies:       Citric acid -potassium citrate  [potassium citrate ] and Mushroom    Social History:     Tobacco:    reports that he has never smoked. He has never used smokeless tobacco.  Alcohol:      reports current alcohol use.  Drug Use:  reports no history of drug use.    Family History:     Family History   Family history unknown: Yes         Physical Exam:     Vitals:  BP 113/69   Pulse 72   Temp 97.7 F (36.5 C) (Oral)   Resp 20   Ht 1.702 m (5' 7)   Wt 79.8 kg (176 lb)   SpO2 93%   BMI  27.57 kg/m   Temp (24hrs), Avg:97.8 F (36.6 C), Min:97.3 F (36.3 C), Max:98.2 F (36.8 C)          General appearance - alert, well appearing, and in no acute distress  Mental status - oriented to person, place, and time with normal affect  Head - normocephalic and atraumatic  Eyes - pupils equal and reactive, extraocular eye movements intact, conjunctiva clear  Ears - hearing appears to be  intact  Nose - no drainage noted  Mouth - mucous membranes moist  Neck - supple, no carotid bruits, thyroid not palpable  Chest - clear to auscultation, normal effort  Heart - normal rate, regular rhythm, no murmur  Abdomen - soft, nontender, nondistended, bowel sounds present all four quadrants, no masses, hepatomegaly or splenomegaly.  Tender back  Neurological - normal speech, no focal findings or movement disorder noted, cranial nerves II through XII grossly intact  Extremities - peripheral pulses palpable, no pedal edema or calf pain with palpation  Skin - no gross lesions, rashes, or induration noted        Data:     Labs:    Hematology:No results for input(s): WBC, RBC, HGB, HCT, MCV, MCH, MCHC, RDW, PLT, MPV, SEDRATE, CRP, INR, DDIMER, CD4TCELL, LABABSO in the last 72 hours.    Invalid input(s): PT  Chemistry:No results for input(s): NA, K, CL, CO2, GLUCOSE, BUN, CREATININE, MG, ANIONGAP, LABGLOM, GFRAA, CALCIUM, CAION, PHOS, PSA, PROBNP, TROPHS, CKTOTAL, CKMB, CKMBINDEX, MYOGLOBIN, DIGOXIN, LACTACIDWB in the last 72 hours.  No results for input(s): LABALBU, LABA1C, T3TOTAL, FT4, TSH, AST, ALT, LDH, GGT, ALKPHOS, BILITOT, BILIDIR, AMMONIA, AMYLASE, LIPASE, LACTATE, CHOL, HDL, CHOLHDLRATIO, TRIG, VLDL, HIV12AB, PHENYTOIN, PHENYF, URICACID, POCGLU in the last 72 hours.    Invalid input(s): PROT, T4TOTAL, LABGGT, LDLCHOLESTEROL    No results found for: INR, PROTIME    Lab Results   Component Value Date/Time    SPECIAL Site: Urine 10/22/2023 03:43 PM     Lab Results   Component Value Date/Time    CULTURE NO GROWTH 10/22/2023 03:43 PM       No results found for: POCPH, PHART, PH, POCPCO2, PCO2ART, PCO2, POCPO2, PO2ART, PO2, POCHCO3, HCO3ART, HCO3, NBEA, PBEA, BEART, BE, THGBART, THB, TCO2ART, POCO2SAT, O2SATART, O2SAT,  FIO2    Radiology:    No results found.      All radiological studies reviewed                Code Status:  Full Code    Electronically signed by Pieter Munda, MD on 07/09/2024 at 9:47 AM     Copy sent to Dr. Verdie, Belvie, MD    This note was created with the assistance of a speech-recognition program.  Although the intention is to generate a document that actually reflects the content of the visit, no guarantees can be provided that every mistake has been identified and corrected by editing.     Note was updated later by me after  physical examination and  completion of the assessment.     "

## 2024-07-09 NOTE — Progress Notes (Signed)
 "Physical Therapy  Facility/Department: [facility]   Physical Therapy Initial Evaluation    Patient Name: Jake Morales        MRN: [id number]    DOB: 1956/04/27    Date of Service: 07/09/2024    HPI - pt presents for elective back surgery.     Past Medical History:  has a past medical history of Bowel obstruction (HCC), Bowel obstruction (HCC), Chronic back pain, Diarrhea, Diverticulitis, Diverticulosis, Duodenal ulcer, History of kidney stones, Hypercholesteremia, Hypothyroid, Knee pain, Numbness and tingling, Peptic ulcer, Personal history of prostate cancer, Prostatitis, acute, Rosacea, SOB (shortness of breath), and Thyroid nodule.  Past Surgical History:  has a past surgical history that includes Appendectomy; Cholecystectomy; Colonoscopy; Prostate Biopsy; Prostatectomy; Bladder surgery; colostomy (2001); Revision Colostomy (2002); colectomy (2001); Small intestine surgery (2001); Small intestine surgery (1980); Inguinal hernia repair (Bilateral); Foot neuroma surgery (Bilateral); Nasal septum surgery; cyst removal (Left); Ganglion cyst excision; Pain management procedure; and lumbar fusion (N/A, 07/08/2024).    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  Pt with noted low BP while supine in the middle of the night; pt with limited tolerance this  date w/ OOB and walking due to likely low BP -> pt became too nausous to attempt to get a formal reading. Pt is very engaged, very independent, and would expect safe discharge. Pt encouraged to follow physician instructions and movement restrictions and the why behind the restrictions. Pt encouraged for a home walking program with short, frequent walks, limiting the time he spends sitting under one hour. Would expect if patient can keep food down that his intolerance to OOB and walking would improve. Will continue to follow while hospitalized.    Therapy Prognosis: Excellent  Decision Making: Medium Complexity  Requires PT Follow-Up: Yes  Activity Tolerance  Activity Tolerance: Patient tolerated treatment well  Safety Devices  Type of Devices: All boney prominences offloaded, All fall risk precautions in place, Call light within reach, Bed alarm in place, Gait belt, Nurse notified, Left in bed, Patient at risk for falls    AM-PAC 23/24       Restrictions/Precautions  Restrictions/Precautions  Restrictions/Precautions: Surgical Protocols  Activity Level: Up with Assist  Required Braces or Orthoses?: Yes  Required Braces or Orthoses  Spinal: Lumbar Corset  Position Activity Restriction  Spinal Precautions: No Bending/Lifting/Twisting (BLT)  Other Position/Activity Restrictions: s/p fusion; bandage in place, dry; IV in place; monitor vitals; urinary cath still in place       Subjective  General  Chart Reviewed: Yes  Patient assessed for rehabilitation services?: Yes  Additional Pertinent Hx: Patient BP was low over night  Response To Previous Treatment: Not applicable  Family/Caregiver Present: No  Follows Commands: Within Functional Limits  Subjective  Subjective: Pt goal is to get back to all of the things he likes to do. 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 [employer]        Objective  Orientation  Overall Orientation Status: Within Functional Limits    Observation/Palpation  Posture: Good  Observation: pt very quick to move; fiercely independent. IV in Rt UE; urinary cath still in place. Pt laying in bed with brace as he said it felt better.    AROM RLE (degrees)  RLE AROM: WFL  AROM LLE (degrees)  LLE AROM : WFL      Strength RLE  Strength RLE: WFL  Comment: functionally tested - normal ability to move legs in bed and with ambulation - full weight bearing strength.  Strength LLE  Strength LLE: WFL  Comment: functionally tested    Mobility   Bed mobility  Rolling to Right: Independent  Supine to Sit: Independent  Sit to Supine: Independent  Bed Mobility Comments: verbal cueing for log roll technique; pt initially tried to sit straight up spontaneously -> patient encouraged to use the log roll technique to decrease the strain on his back.    Transfers  Sit to Stand: Stand by assistance  Stand to Sit: Stand by assistance  Bed to Chair: Stand by assistance    Ambulation  Surface: Level tile  Device: Rolling Walker  Assistance: Contact guard assistance  Quality of Gait: gait with both tactile and verbal cueing for improved gait pattern. Pt education on r.walker use and sizing. Pt ed on donning and doffing lumber brace.  Gait Deviations: Slow Cadence;Decreased step length;Decreased step height  Distance: 150 ft, 3 ft x1   Comments: Distance cut short as patient w/ a bout of dizziness. Assisted to chair -> pt felt like vomiting -> assisted back to supine and nausea resolved.  Nausea likely from low BP.    Gait training provided. Pt education on proper  sizing of device with visual demonstration of proper use. Gait instruction provided including verbal cueing for sequencing and weight shifting. Worked on step length symmetry and normalized gait pattern. Pt with good demonstration of r.walker use for stability and safety.              Balance   Balance  Sitting - Static: Good  Sitting - Dynamic: Good  Standing - Static: Good  Standing - Dynamic: Good (w/rwalker)    Exercise  PT Exercises  Exercise Treatment: Circulation exercises for hand and feet; spirometer - reviewed correct technique and frequency and the WHY to do it post op. Reviewed home walking program with short, frequent walking and the WHY    Patient Education  Patient Education  Education Given To: Patient  Education Provided: Role of Therapy;Plan of Care  Education Provided Comments: importance of OOB; circulation ex's, positionig and pressure relief. Brace don /doffing; log roll. Review of no lifting, bending or twisting  Education Method: Demonstration;Verbal  Barriers to Learning: None  Education Outcome: Continued education needed    Plan  Physical Therapy Plan  General Plan: 2 times a  day 7 days a week  Specific Instructions for Next Treatment: gait / steps / brace don /doff / precautions  Current Treatment Recommendations: Functional mobility training, Transfer training, Stair training, Gait training, Positioning, Therapeutic activities, Safety education & training  Additional Comments: home walking program    Goals  Patient Goals   Patient Goals : Pt goal is pain relief and return of normal function  Short Term Goals  Time Frame for Short Term Goals: 4  Short Term Goal 1: Pt independent w/ log roll technique  Short Term Goal 2: Pt able to demonstrate and verbalize spine precautions  Short Term Goal 3: Pt able to don/ doff lumbar brace  Short Term Goal 4: Pt able to amb x 400 ft w/ rwalker independent  Short Term Goal 5: Pt able to ascend/ descend 4 steps with one rail independently    Minutes  PT  Individual Minutes  Time In: 0811  Time Out: 0852  Minutes: 41  Total time 52 minutes. Treatment minutes. 30     Electronically signed by JON CONTRAS, PT on 07/09/24 at 9:13 AM EDT      "

## 2024-07-09 NOTE — Plan of Care (Signed)
"    Problem: Discharge Planning  Goal: Discharge to home or other facility with appropriate resources  07/09/2024 0544 by Brinton Lauraine BRAVO, RN  Outcome: Progressing     Problem: Pain  Goal: Verbalizes/displays adequate comfort level or baseline comfort level  07/09/2024 0544 by Brinton Lauraine BRAVO, RN  Outcome: Progressing     Problem: Safety - Adult  Goal: Free from fall injury  07/09/2024 0544 by Brinton Lauraine BRAVO, RN  Outcome: Progressing     Problem: Neurosensory - Adult  Goal: Achieves maximal functionality and self care  Outcome: Progressing     Problem: Gastrointestinal - Adult  Goal: Minimal or absence of nausea and vomiting  Outcome: Progressing     Problem: Gastrointestinal - Adult  Goal: Maintains or returns to baseline bowel function  Outcome: Progressing     "

## 2024-07-09 NOTE — Progress Notes (Signed)
"    07/09/2024  7:18 AM     Jake Morales    1956/09/21   [id number]      SUBJECTIVE: Uneventful PM per nursing, pt doing well this AM. No further nausea. C/O some low back discomfort, states no new N/T in UE or LE. OOB with walker. Eating and drinking well.    OBJECTIVE      Physical      Temperature Range (24h):Temp: 98.2 F (36.8 C) Temp  Avg: 97.8 F (36.6 C)  Min: 97.3 F (36.3 C)  Max: 98.2 F (36.8 C)  BP Range (24h): Systolic (24hrs), Avg:142 , Min:94 , Max:159     Diastolic (24hrs), Avg:76, Min:50, Max:89    Pulse Range (24h): Pulse  Avg: 80.3  Min: 72  Max: 90  Respiration Range (24h): Resp  Avg: 14.2  Min: 13  Max: 16  Current Pulse Ox (24h):  SpO2: 94 %  Pulse Ox Range (24h):  SpO2  Avg: 93.9 %  Min: 93 %  Max: 95 %    In: 3380 [P.O.:1180; I.V.:2200]  Out: 2805 [Urine:2325; Drains:280]    Good strength and sensation in both UE and LE.  Incision CDI. Dressing changed at bedside.      ASSESSMENT AND PLAN    68 y.o. male status post L3-5 PLIF post op day #  1    1. OK for patient to be discharged home this afternoon if doing well, eating and drinking, OOB to halls, urinating normally. F/U care and wound care instructions given at bedside. F/U with Dr. Reinhold in 2 weeks for staple removal.      Electronically signed by Redell Reinhold, MD on 07/09/2024 at 7:18 AM   "

## 2024-07-11 MED ORDER — ONDANSETRON 4 MG PO TBDP
4 | ORAL_TABLET | Freq: Three times a day (TID) | ORAL | 0 refills | 7.00000 days | Status: AC | PRN
Start: 2024-07-11 — End: ?

## 2024-07-11 MED ORDER — POLYETHYLENE GLYCOL 3350 17 GM/SCOOP PO POWD
17 | Freq: Every day | ORAL | 0 refills | Status: AC
Start: 2024-07-11 — End: 2024-07-18

## 2024-07-11 MED ORDER — SENNA-DOCUSATE SODIUM 8.6-50 MG PO TABS
8.6-50 | ORAL_TABLET | Freq: Every day | ORAL | 0 refills | 30.00000 days | Status: AC
Start: 2024-07-11 — End: ?

## 2024-07-12 ENCOUNTER — Inpatient Hospital Stay
Admit: 2024-07-12 | Discharge: 2024-07-12 | Disposition: A | Discharge status: Home / Self Care | Payer: Medicare (Managed Care) | Arrived: VH | Attending: Emergency Medicine

## 2024-07-12 ENCOUNTER — Emergency Department
Admit: 2024-07-12 | Payer: Medicare (Managed Care) | Primary: Student in an Organized Health Care Education/Training Program

## 2024-07-12 DIAGNOSIS — K5900 Constipation, unspecified: Principal | ICD-10-CM

## 2024-07-12 LAB — CBC WITH AUTO DIFFERENTIAL
Basophils %: 0 % (ref 0–2)
Basophils Absolute: 0 k/uL (ref 0.00–0.20)
Eosinophils %: 1 % (ref 1–4)
Immature Granulocytes Absolute: 0.07 k/uL (ref 0.00–0.30)
Lymphocytes Absolute: 0.57 k/uL — ABNORMAL LOW (ref 1.10–3.70)
MCH: 31.3 pg (ref 25.2–33.5)
MCHC: 34.1 g/dL (ref 28.4–34.8)
MCV: 91.7 fL (ref 82.6–102.9)
Monocytes %: 12 % (ref 3–12)
Monocytes Absolute: 0.85 k/uL (ref 0.10–1.20)
RBC: 3.74 m/uL — ABNORMAL LOW (ref 4.21–5.77)
RDW: 12.2 % (ref 11.8–14.4)
WBC: 7.1 k/uL (ref 3.5–11.3)

## 2024-07-12 LAB — BASIC METABOLIC PANEL
Anion Gap: 7 mmol/L — ABNORMAL LOW (ref 9–16)
BUN: 16 mg/dL (ref 8–23)
CO2: 30 mmol/L (ref 20–31)
Est, Glom Filt Rate: >90 mL/min/1.73m2 (ref >60)
Potassium: 3.8 mmol/L (ref 3.7–5.3)
Sodium: 136 mmol/L (ref 136–145)

## 2024-07-12 MED ORDER — LACTULOSE 10 GM/15ML PO SOLN
10 | Freq: Once | ORAL | Status: AC
Start: 2024-07-12 — End: 2024-07-12
  Administered 2024-07-12: 19:00:00 20 g via ORAL

## 2024-07-12 MED ORDER — SODIUM CHLORIDE 0.9 % IV BOLUS
0.9 | Freq: Once | INTRAVENOUS | Status: AC
Start: 2024-07-12 — End: 2024-07-12
  Administered 2024-07-12: 16:00:00 80 mL via INTRAVENOUS

## 2024-07-12 MED ORDER — FLEET ENEMA RE ENEM
Freq: Once | RECTAL | Status: AC
Start: 2024-07-12 — End: 2024-07-12
  Administered 2024-07-12: 20:00:00 1 mL via RECTAL

## 2024-07-12 MED ORDER — NORMAL SALINE FLUSH 0.9 % IV SOLN
0.9 | INTRAVENOUS | Status: DC | PRN
Start: 2024-07-12 — End: 2024-07-12
  Administered 2024-07-12: 16:00:00 10 mL via INTRAVENOUS

## 2024-07-12 MED ORDER — LACTULOSE 10 G PO PACK
10 | PACK | Freq: Three times a day (TID) | ORAL | 0 refills | Status: AC | PRN
Start: 2024-07-12 — End: ?

## 2024-07-12 MED ORDER — SODIUM CHLORIDE 0.9 % IV BOLUS
0.9 | Freq: Once | INTRAVENOUS | Status: AC
Start: 2024-07-12 — End: 2024-07-12
  Administered 2024-07-12: 16:00:00 1000 mL via INTRAVENOUS

## 2024-07-12 MED ORDER — IOPAMIDOL 76 % IV SOLN
76 | Freq: Once | INTRAVENOUS | Status: AC | PRN
Start: 2024-07-12 — End: 2024-07-12
  Administered 2024-07-12: 16:00:00 75 mL via INTRAVENOUS

## 2024-07-12 MED ORDER — GLYCERIN (LAXATIVE) 2 G RE SUPP
2 | Freq: Once | RECTAL | Status: AC
Start: 2024-07-12 — End: 2024-07-12
  Administered 2024-07-12: 19:00:00 2 via RECTAL

## 2024-07-12 MED FILL — LACTULOSE 10 GM/15ML PO SOLN: 10 GM/15ML | ORAL | Qty: 30

## 2024-07-12 MED FILL — GLYCERIN (ADULT) 2 G RE SUPP: 2 g | RECTAL | Qty: 1

## 2024-07-12 NOTE — ED Provider Notes (Signed)
 "EMERGENCY DEPARTMENT ENCOUNTER    Pt Name: Jake Morales  MRN: [id number]  Birthdate 05/27/1956  Date of evaluation: 07/12/24  CHIEF COMPLAINT       Chief Complaint   Patient presents with    Constipation     Back surgery on Thursday. Reports last BM on Wednesday.     HISTORY OF PRESENT ILLNESS   The history is provided by the patient and medical records.    The patient is a 68 year old male with history of chronic back pain status post lumbar fusion 07/08/2024, small bowel obstruction, diverticulitis, duodenal ulcer and hyperlipidemia who presents ED for constipation.  Patient has been constipated since his lumbar fusion, 4 days ago.  He had a small, pinky sized stool this morning.  He is prescribed Percocet  and Zanaflex  as well as MiraLAX , senna/docusate and probiotics.  He is passing flatus. He denies nausea and vomiting.    REVIEW OF SYSTEMS     Review of Systems  All other systems reviewed and are negative.    PASTMEDICAL HISTORY     Past Medical History:   Diagnosis Date    Bowel obstruction (HCC) 12/2017    Bowel obstruction (HCC)     history of 2 episodes that resolved without surgery    Chronic back pain     Diarrhea     related to prior bowel surgeries    Diverticulitis     had colectomy and colostomy    Diverticulosis     Duodenal ulcer     History of kidney stones     X1, passed on his own    Hypercholesteremia     Hypothyroid     Knee pain     Numbness and tingling     intermittant, more in left leg than right    Peptic ulcer     Personal history of prostate cancer     Prostatitis, acute     Rosacea     SOB (shortness of breath) 2024    About 1 year ago (2024) he had some problems with shortness of breath when tying his shoes and also while in [place]  because of the higher altitude.  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    History of kidney stones Z87.442    Spinal stenosis of lumbar region with neurogenic claudication M48.062    Spondylolisthesis at L4-L5 level M43.16    Lumbar radiculopathy M54.16    Renal calculus, left N20.0    Lumbar surgical wound fluid collection, initial encounter T81.89XA     SURGICAL HISTORY       Past Surgical History:   Procedure Laterality Date    APPENDECTOMY      BLADDER SURGERY      [facility] 06-13-2009; cystoscopy    CHOLECYSTECTOMY  COLECTOMY  2001    with colostomy for diverticulitis    COLONOSCOPY      COLOSTOMY  2001    CYST REMOVAL Left     hand    FOOT NEUROMA SURGERY Bilateral     GANGLION CYST EXCISION      from back    INGUINAL HERNIA REPAIR Bilateral     LUMBAR FUSION N/A 07/08/2024    LUMBAR INTERBODY FUSION POSTERIOR L3-L5, C ARM performed by Reinhold Rogue, MD at [facility]    NASAL SEPTUM SURGERY      X2 surgeries    PAIN MANAGEMENT PROCEDURE      multiple epidural steroid injections, SI joint injection, radiofrequency ablation    PROSTATE BIOPSY      11-28-2000    PROSTATECTOMY      01-13-2001    REVISION COLOSTOMY  2002    SMALL INTESTINE SURGERY  2001    due to blockage caused by adhesions from prior bowel surgery    SMALL INTESTINE SURGERY  1980    surgery for duodenal ulcer     CURRENT MEDICATIONS       Current Discharge Medication List        CONTINUE these medications which have NOT CHANGED    Details   sennosides-docusate sodium  (SENOKOT-S) 8.6-50 MG tablet Take 2 tablets by mouth daily  Qty: 60 tablet, Refills: 0      !! polyethylene glycol (GLYCOLAX ) 17  GM/SCOOP powder Take 17 g by mouth daily for 7 days  Qty: 510 g, Refills: 0      ondansetron  (ZOFRAN -ODT) 4 MG disintegrating tablet Take 1 tablet by mouth 3 times daily as needed for Nausea or Vomiting  Qty: 21 tablet, Refills: 0      oxyCODONE -acetaminophen  (PERCOCET ) 5-325 MG per tablet Take 1 tablet by mouth every 4 hours as needed for Pain for up to 7 days. Max Daily Amount: 6 tablets  Qty: 42 tablet, Refills: 0    Comments: Reduce doses taken as pain becomes manageable  Associated Diagnoses: Spondylolisthesis at L4-L5 level      tiZANidine  (ZANAFLEX ) 2 MG tablet Take 1 tablet by mouth 3 times daily as needed (muscle pain)  Qty: 40 tablet, Refills: 0      Azelaic Acid 15 % GEL Apply topically See Admin Instructions      levothyroxine  (SYNTHROID ) 50 MCG tablet Take 1 tablet by mouth Daily      !! Polyethylene Glycol 3350  (MIRALAX  PO) Take by mouth in the morning and at bedtime      Probiotic Product (PROBIOTIC PO) Take 1 capsule by mouth daily      Multiple Vitamins-Minerals (THERAPEUTIC MULTIVITAMIN-MINERALS) tablet Take 1 tablet by mouth daily       !! - Potential duplicate medications found. Please discuss with provider.        ALLERGIES     is allergic to citric acid -potassium citrate  [potassium citrate ] and mushroom.  FAMILY HISTORY     has no family status information on file.      SOCIAL HISTORY       Social History     Tobacco Use    Smoking status: Never    Smokeless tobacco: Never   Vaping Use    Vaping status: Never Used   Substance Use Topics    Alcohol use: Not Currently     Comment: once in a while    Drug use: No     PHYSICAL EXAM     INITIAL VITALS: BP ROLLEN)  124/95   Pulse 83   Temp 97.9 F (36.6 C) (Oral)   Resp 16   SpO2 98%    Physical Exam  Vitals and nursing note reviewed.  Constitutional:       Appearance: Patient lying in exam bed, looking mildly uncomfortable however nontoxic-appearing, wearing a lumbar support.  Wife is at bedside.  HENT:      Head: Normocephalic.   Eyes:       Conjunctiva/sclera: Conjunctivae normal.   Cardiovascular:      Rate and Rhythm: Normal rate.   Pulmonary:      Effort: Pulmonary effort is normal.   Abdominal:      General: Abdomen is soft, nontender and nondistended.  Musculoskeletal:      General: No muscular tenderness.   Skin:     General: Skin is dry.   Neurological:      Mental Status: Patient is alert. Mental status is at baseline.     MEDICAL DECISION MAKING / ED COURSE:     1)  Number and Complexity of Problems  Problem List This Visit: Constipation    Differential Diagnosis: Appropriate constipation status post surgery and opiate analgesia as    Diagnoses Considered but Do Not Suspect: Diverticulitis, SBO, mesenteric ischemia, referred cardiac pain.    2)  Data Reviewed  Patient's EKG independently interpreted by me: N/A    Decision Rules/Scores utilized:  N/A    HEART SCORE: N/A, no chest pain.    NIH STROKE SCALE: N/A, no focal neurodeficits.    External Documents Reviewed: I have independently reviewed patient's previous medical records including labs, notes and imaging.    Tests considered but not ordered and why:  N/A    Imaging that is independently reviewed and interpreted by me as: N/A    See more data below for the lab and radiology tests and orders.    3)  Treatment and Disposition    Patient repeat assessment: The patient is hemodynamically stable.    Labs demonstrating normal sodium, normal potassium, normal creatinine, normal glucose, normal white count, hemoglobin 9.7.    CT on pelvis negative for acute pathology, does show moderate to large stool burden, bilateral nonobstructive renal calculi.    Patient administered glycerin  suppository enema and lactulose  in the ED and patient was able to produce moderate amount of stool.    Patient is requesting discharge.  Provided Rx for lactulose .    All questions answered.  Given strict return precautions.  No further work-up indicated at this time.    Social determinants of health impacting  treatment or disposition:  None    Shared Decision Making completed with patient regarding risks and benefits of admission versus discharge.  Patient decides to be discharged home.    Code Status Discussion:  Not discussed    MIPS:  N/A    ED Course Notes From Epic Narrator:         CRITICAL CARE:   N/A    PROCEDURES:  Procedures      DATA FOR LAB AND RADIOLOGY TESTS ORDERED BELOW ARE REVIEWED BY THE ED CLINICIAN:    RADIOLOGY: All x-rays, CT, MRI, and formal ultrasound images (except ED bedside ultrasound) are read by the radiologist, see reports below, unless otherwise noted in MDM or here.  Reports below are reviewed by myself.  CT ABDOMEN PELVIS W IV CONTRAST Additional Contrast? None   Final Result   1. No acute intra-abdominal or pelvic process.   2. Moderate to large  stool burden.   3. Bilateral nonobstructive renal calculi.             LABS: Lab orders shown below, the results are reviewed by myself, and all abnormals are listed below.  Labs Reviewed   CBC WITH AUTO DIFFERENTIAL - Abnormal; Notable for the following components:       Result Value    RBC 3.74 (*)     Hemoglobin 11.7 (*)     Hematocrit 34.3 (*)     Neutrophils % 78 (*)     Lymphocytes % 8 (*)     Immature Granulocytes % 1 (*)     Lymphocytes Absolute 0.57 (*)     All other components within normal limits   BASIC METABOLIC PANEL - Abnormal; Notable for the following components:    Anion Gap 7 (*)     Calcium 8.5 (*)     All other components within normal limits       Vitals Reviewed:    Vitals:    07/12/24 1045   BP: (!) 124/95   Pulse: 83   Resp: 16   Temp: 97.9 F (36.6 C)   TempSrc: Oral   SpO2: 98%     MEDICATIONS GIVEN TO PATIENT THIS ENCOUNTER:  Orders Placed This Encounter   Medications    sodium chloride  0.9 % bolus 1,000 mL    sodium chloride  flush 0.9 % injection 10 mL    iopamidol  (ISOVUE -370) 76 % injection 75 mL    sodium chloride  0.9 % bolus 80 mL    lactulose  (CHRONULAC ) 10 GM/15ML solution 20 g    glycerin  (ADULT) suppository  2 g    sodium phosphate  (FLEET) rectal enema 1 enema    lactulose  (CEPHULAC ) 10 g packet     Sig: Take 1 packet by mouth 3 times daily as needed (Constipation)     Dispense:  15 packet     Refill:  0     DISCHARGE PRESCRIPTIONS:  Current Discharge Medication List        START taking these medications    Details   lactulose  (CEPHULAC ) 10 g packet Take 1 packet by mouth 3 times daily as needed (Constipation)  Qty: 15 packet, Refills: 0           PHYSICIAN CONSULTS ORDERED THIS ENCOUNTER:  None  FINAL IMPRESSION      1. Constipation, unspecified constipation type          DISPOSITION/PLAN   DISPOSITION Decision To Discharge 07/12/2024 04:38:45 PM   DISPOSITION CONDITION Stable           OUTPATIENT FOLLOW UP THE PATIENT:  Verdie Dover, MD  [address]  [address]  [place] [address]  [phone]    Schedule an appointment as soon as possible for a visit in 2 days  ED follow-up      Fairy Aase, MD       Aase Fairy SAUNDERS, MD  07/12/24 1641    "

## 2024-07-12 NOTE — Discharge Instructions (Signed)
"  Return to this emergency room immediately if your symptoms persist, worsen or if new ones form.    Make sure you follow-up with your primary care doctor within the next 1-2 business days.  "

## 2024-07-13 NOTE — Telephone Encounter (Signed)
"  Patient called nurse line with complaints of increased redness, warmth and a firm are left of incision. Pt is afebrile. Pt had a PLIF L3-5 on 07/08/2024.    Next appt scheduled on 07/14/2024.  "

## 2024-07-14 ENCOUNTER — Ambulatory Visit
Admit: 2024-07-14 | Discharge: 2024-07-14 | Payer: Medicare (Managed Care) | Attending: Surgical | Primary: Student in an Organized Health Care Education/Training Program

## 2024-07-14 VITALS — BP 142/80 | HR 76 | Ht 67.0 in | Wt 176.0 lb

## 2024-07-14 DIAGNOSIS — M4316 Spondylolisthesis, lumbar region: Principal | ICD-10-CM

## 2024-07-14 NOTE — Progress Notes (Signed)
"  I had the pleasure of seeing Jake Morales in the office today in follow up. Patient is a 68 y.o.. male who underwent lower back surgery with instrumentation and fusion from the L3-L5 levels 1 week ago.  Postoperatively he noted a degree of drainage from his incision and thus he did present today for a wound check.  He denies any fevers chills or night sweats.  Vitals:    07/14/24 1115   BP: (!) 142/80   Pulse: 76     Examination today demonstrates the incision to be healing well. The incision is nonerythematous and nontender.  Patient has age-appropriate strength in both upper and lower extremities.  Gait is steady.    I reviewed remaining activity restrictions and limitations for the upcoming months.  I did take this opportunity to answer any remaining questions the patient had in the office today and welcomed them to feel free to contact us  back at any point in the interim should they have any problems or questions we could be of assistance with. We will plan on seeing the patient back in our office in 1 week for staple removal.  Dr. Reinhold did come into the room and review my full history and examination. He was involved in both the assessment and treatment plan for this patient today in the office. I did see the patient in collaboration with him today in the office.  "

## 2024-07-21 ENCOUNTER — Encounter

## 2024-07-21 ENCOUNTER — Ambulatory Visit
Admit: 2024-07-21 | Discharge: 2024-07-21 | Payer: Medicare (Managed Care) | Primary: Student in an Organized Health Care Education/Training Program

## 2024-07-21 MED ORDER — OXYCODONE-ACETAMINOPHEN 5-325 MG PO TABS
5-325 | ORAL_TABLET | Freq: Four times a day (QID) | ORAL | 0 refills | Status: AC | PRN
Start: 2024-07-21 — End: 2024-07-28

## 2024-07-21 MED ORDER — TIZANIDINE HCL 2 MG PO TABS
2 | ORAL_TABLET | Freq: Three times a day (TID) | ORAL | 0 refills | Status: AC | PRN
Start: 2024-07-21 — End: 2024-08-04

## 2024-07-21 NOTE — Telephone Encounter (Signed)
"  Patient seen in office for staple removal after L3-L5 PLIF on 07/08/2024. Pt rates pain 6 out of 10 to bilateral hips and lower back mostly at HS. Pt continues to use Percocet  5/325mg , Tizanidine  2mg , Tylenol  and ice throughout the day.     [facility] report shows Percocet  5-325 mg x 7 #42 Start: 07/08/2024 Due out: 07/15/2024.     Next scheduled appt on 08/02/2024.    J.Fender PA-C please advise, TY.   "

## 2024-07-21 NOTE — Progress Notes (Signed)
"  Nurse Visit Suture Removal/Wound Check        Date:  07/21/2024   Patient:  Jake Morales   DOB:  21-Jun-1956   Reason for appt:    Chief Complaint   Patient presents with    Suture / Staple Removal     Sx 07/08/2024 L3-5 PLIF        Operating Physician:  Dr. Reinhold, MD    Procedure:  L3-L5 PLIF  Date of Procedure:  07/08/2024  Vital signs:  There were no vitals taken for this visit.     ______________________________________________________________________________  Patient reported S/SX post-op:   []  Fever     []  Drainage   []  Redness   []  Swelling    [x]  Pain   []  Other     Current Drainage:  no    Skin Edges Approximated:  yes -     Wound Measurements:      Current Observation of Incision Site/Wound:  Incision appears to be healing well, no s/s of infection or delayed healing.     Evaluated by Physician:  no    Pain Assessment:  Pt rates pain 6 out of 10, mostly an aching to bilateral hips towards the end of the day    Treatment:  Incision cleansed with iodine swab, staples removed with ease, island dressing applied    Return Visit: No follow-ups on file.   Appointment scheduled: 08/02/2024   Graeme Butter, LPN   "

## 2024-07-26 ENCOUNTER — Encounter
Payer: Medicare (Managed Care) | Attending: Neurological Surgery | Primary: Student in an Organized Health Care Education/Training Program

## 2024-08-02 ENCOUNTER — Inpatient Hospital Stay
Admit: 2024-08-02 | Discharge: 2024-08-02 | Payer: Medicare (Managed Care) | Primary: Student in an Organized Health Care Education/Training Program

## 2024-08-02 ENCOUNTER — Ambulatory Visit
Admit: 2024-08-02 | Discharge: 2024-08-02 | Payer: Medicare (Managed Care) | Attending: Neurological Surgery | Primary: Student in an Organized Health Care Education/Training Program

## 2024-08-02 VITALS — BP 131/77 | HR 78 | Ht 67.0 in | Wt 176.0 lb

## 2024-08-02 DIAGNOSIS — M48062 Spinal stenosis, lumbar region with neurogenic claudication: Principal | ICD-10-CM

## 2024-08-02 NOTE — Progress Notes (Signed)
"    [facility], [facility]  [facility] [place], [facility]  [address], [address]  [place] [address]  Dept: [phone]  Dept Fax: [phone]     Patient:  Jake Morales  Date of Birth: [birth year]  Date: 08/02/24      Chief Complaint   Patient presents with    Post-Op Check     4 week 1st post op, sx: L3-5 PLIF 07/08/2024            HPI:     I had the pleasure of seeing this patient back in the office today in follow-up.  As you know he is a 68 year old male who underwent lower back surgery with instrumentation and fusion at the L3-L5 levels approxi-1 month ago.  Postoperatively he seems to be doing well.  He describes no ongoing pain in the lower extremities.  He continues to wear his back brace each day.    Examination today demonstrates his back incision to be healing well.  The incision is flat, nonerythematous nontender.  He has good strength and sensation both lower extremities.  Gait is steady.    Follow-up lumbar x-rays are reviewed.  These x-rays demonstrate the instrumentation to be in good position both in flexion extension positions with no evidence of instability.  I did review these images in the office today with the patient.  I took this opportunity to discuss remaining activity restrictions and limitations for upcoming several months.  I am going to place him into physical therapy including water  and land-based therapy.  He will continue to wear his back brace.  I am scheduling him to return my office in 2 months for his next postoperative visit with x-rays.  In the meantime I did invite him to feel free to contact me should he have further problems or questions which I can be of assistance with in the interim.        Physical Exam:      BP 131/77 (BP Site: Left Upper Arm, Patient Position: Sitting, BP Cuff Size: Medium Adult)   Pulse 78   Ht 1.702 m (5' 7)   Wt 79.8 kg (176 lb)   BMI 27.57 kg/m         Assessment and Plan:     1.  Spinal stenosis of lumbar region with neurogenic claudication              Electronically signed by Redell Bellingham, MD on 08/02/2024 at 11:08 AM    Please note that this chart was generated using voice recognition Dragon dictation software.  Although every effort was made to ensure the accuracy of this automated transcription, some errors in transcription may have occurred.          "

## 2024-08-02 NOTE — Telephone Encounter (Signed)
"  08/02/2024 - patient calling to request his physical therapy referral to be faxed to [facility]. Fax# [phone] - request sent  "

## 2024-10-04 ENCOUNTER — Inpatient Hospital Stay
Admit: 2024-10-04 | Payer: Medicare (Managed Care) | Primary: Student in an Organized Health Care Education/Training Program

## 2024-10-04 ENCOUNTER — Ambulatory Visit
Admit: 2024-10-04 | Discharge: 2024-10-04 | Payer: Medicare (Managed Care) | Attending: Neurological Surgery | Primary: Student in an Organized Health Care Education/Training Program

## 2024-10-04 VITALS — BP 118/78 | HR 86 | Ht 67.0 in | Wt 176.0 lb

## 2024-10-04 DIAGNOSIS — M48062 Spinal stenosis, lumbar region with neurogenic claudication: Principal | ICD-10-CM

## 2024-10-04 NOTE — Progress Notes (Signed)
"    [facility], [facility]  [facility] [place], [facility]  [address], [address]  [place] [address]  Dept: [phone]  Dept Fax: [phone]     Patient:  Jake Morales  Date of Birth: 1955/10/20  Date: 10/04/24      Chief Complaint   Patient presents with    Post-Op Check     3 month post op with XR sx: L3-5 PLIF 07/08/2024            HPI:     I had the pleasure of seeing this patient back in the office today in follow-up.  As you know he is a 69 year old male who underwent lower back surgery with instrumentation and fusion at the L3-L5 levels 3 months ago.  Postoperatively he continues to do very well.  He has no pain rating the lower extremities.  He describes minimal remaining discomfort in his back.  Overall he indicates he feels nearly 100% improved compared to his preoperative status.    Examination today demonstrates his back incision be well-healed.  The incision is flat, nonerythematous nontender.  He has good strength and sensation both lower extremities.  Gait is steady.    Recent follow-up lumbar x-rays are reviewed.  These images demonstrate the instrumentation to be in good position both in AP and lateral views.  I did review these images in the office today with the patient.  I took this opportunity to discuss any remaining activity restrictions or limitations for the upcoming several months.  I took this opportunity to answer intermittent questions that the patient may have had.  I am schedule him to return to my office in 3 months for his next postoperative visit with x-rays.  In the meantime I did invite him to feel free to contact me should he have further problems or questions which I can be of assistance with in the interim.        Physical Exam:      BP 118/78 (BP Site: Left Upper Arm, Patient Position: Sitting, BP Cuff Size: Medium Adult)   Pulse 86   Ht 1.702 m (5' 7)   Wt 79.8 kg (176 lb)   BMI 27.57 kg/m          Assessment and Plan:     1. Spinal stenosis of lumbar region with neurogenic claudication              Electronically signed by Redell Bellingham, MD on 10/04/2024 at 2:36 PM    Please note that this chart was generated using voice recognition Dragon dictation software.  Although every effort was made to ensure the accuracy of this automated transcription, some errors in transcription may have occurred.          "
